# Patient Record
Sex: Male | Born: 1965
Health system: Southern US, Community
[De-identification: ages and names within clinical notes are randomized; demographics above are authoritative.]

## PROBLEM LIST (undated history)

## (undated) DIAGNOSIS — J309 Allergic rhinitis, unspecified: Secondary | ICD-10-CM

## (undated) DIAGNOSIS — T7840XA Allergy, unspecified, initial encounter: Secondary | ICD-10-CM

## (undated) DIAGNOSIS — IMO0002 Reserved for concepts with insufficient information to code with codable children: Secondary | ICD-10-CM

## (undated) DIAGNOSIS — J45909 Unspecified asthma, uncomplicated: Secondary | ICD-10-CM

## (undated) DIAGNOSIS — I10 Essential (primary) hypertension: Secondary | ICD-10-CM

## (undated) DIAGNOSIS — M1990 Unspecified osteoarthritis, unspecified site: Secondary | ICD-10-CM

## (undated) DIAGNOSIS — J449 Chronic obstructive pulmonary disease, unspecified: Secondary | ICD-10-CM

## (undated) HISTORY — DX: Reserved for concepts with insufficient information to code with codable children: IMO0002

## (undated) HISTORY — DX: Unspecified asthma, uncomplicated: J45.909

## (undated) HISTORY — DX: Allergy, unspecified, initial encounter: T78.40XA

## (undated) HISTORY — DX: Chronic obstructive pulmonary disease, unspecified: J44.9

## (undated) HISTORY — DX: Allergic rhinitis, unspecified: J30.9

## (undated) HISTORY — DX: Unspecified osteoarthritis, unspecified site: M19.90

## (undated) HISTORY — DX: Essential (primary) hypertension: I10

---

## 2007-05-13 ENCOUNTER — Emergency Department (HOSPITAL_COMMUNITY): Admission: EM | Admit: 2007-05-13 | Discharge: 2007-05-13 | Payer: Self-pay | Admitting: Emergency Medicine

## 2007-09-27 ENCOUNTER — Encounter: Payer: Self-pay | Admitting: Pulmonary Disease

## 2007-09-27 DIAGNOSIS — J45909 Unspecified asthma, uncomplicated: Secondary | ICD-10-CM | POA: Insufficient documentation

## 2011-06-25 DIAGNOSIS — I10 Essential (primary) hypertension: Secondary | ICD-10-CM | POA: Insufficient documentation

## 2012-03-07 ENCOUNTER — Other Ambulatory Visit: Payer: Self-pay | Admitting: Allergy and Immunology

## 2012-03-07 ENCOUNTER — Ambulatory Visit
Admission: RE | Admit: 2012-03-07 | Discharge: 2012-03-07 | Disposition: A | Discharge status: Home / Self Care | Payer: Self-pay | Source: Ambulatory Visit | Attending: Allergy and Immunology | Admitting: Allergy and Immunology

## 2012-03-07 DIAGNOSIS — R05 Cough: Secondary | ICD-10-CM

## 2012-03-07 DIAGNOSIS — R059 Cough, unspecified: Secondary | ICD-10-CM

## 2013-04-16 IMAGING — CR DG CHEST 2V
2 series · 2 of 2 positions shown · non-contrast
Comparison: No priors.

CLINICAL DATA: Cough and shortness of breath.

CHEST - 2 VIEW

[w chest pa]
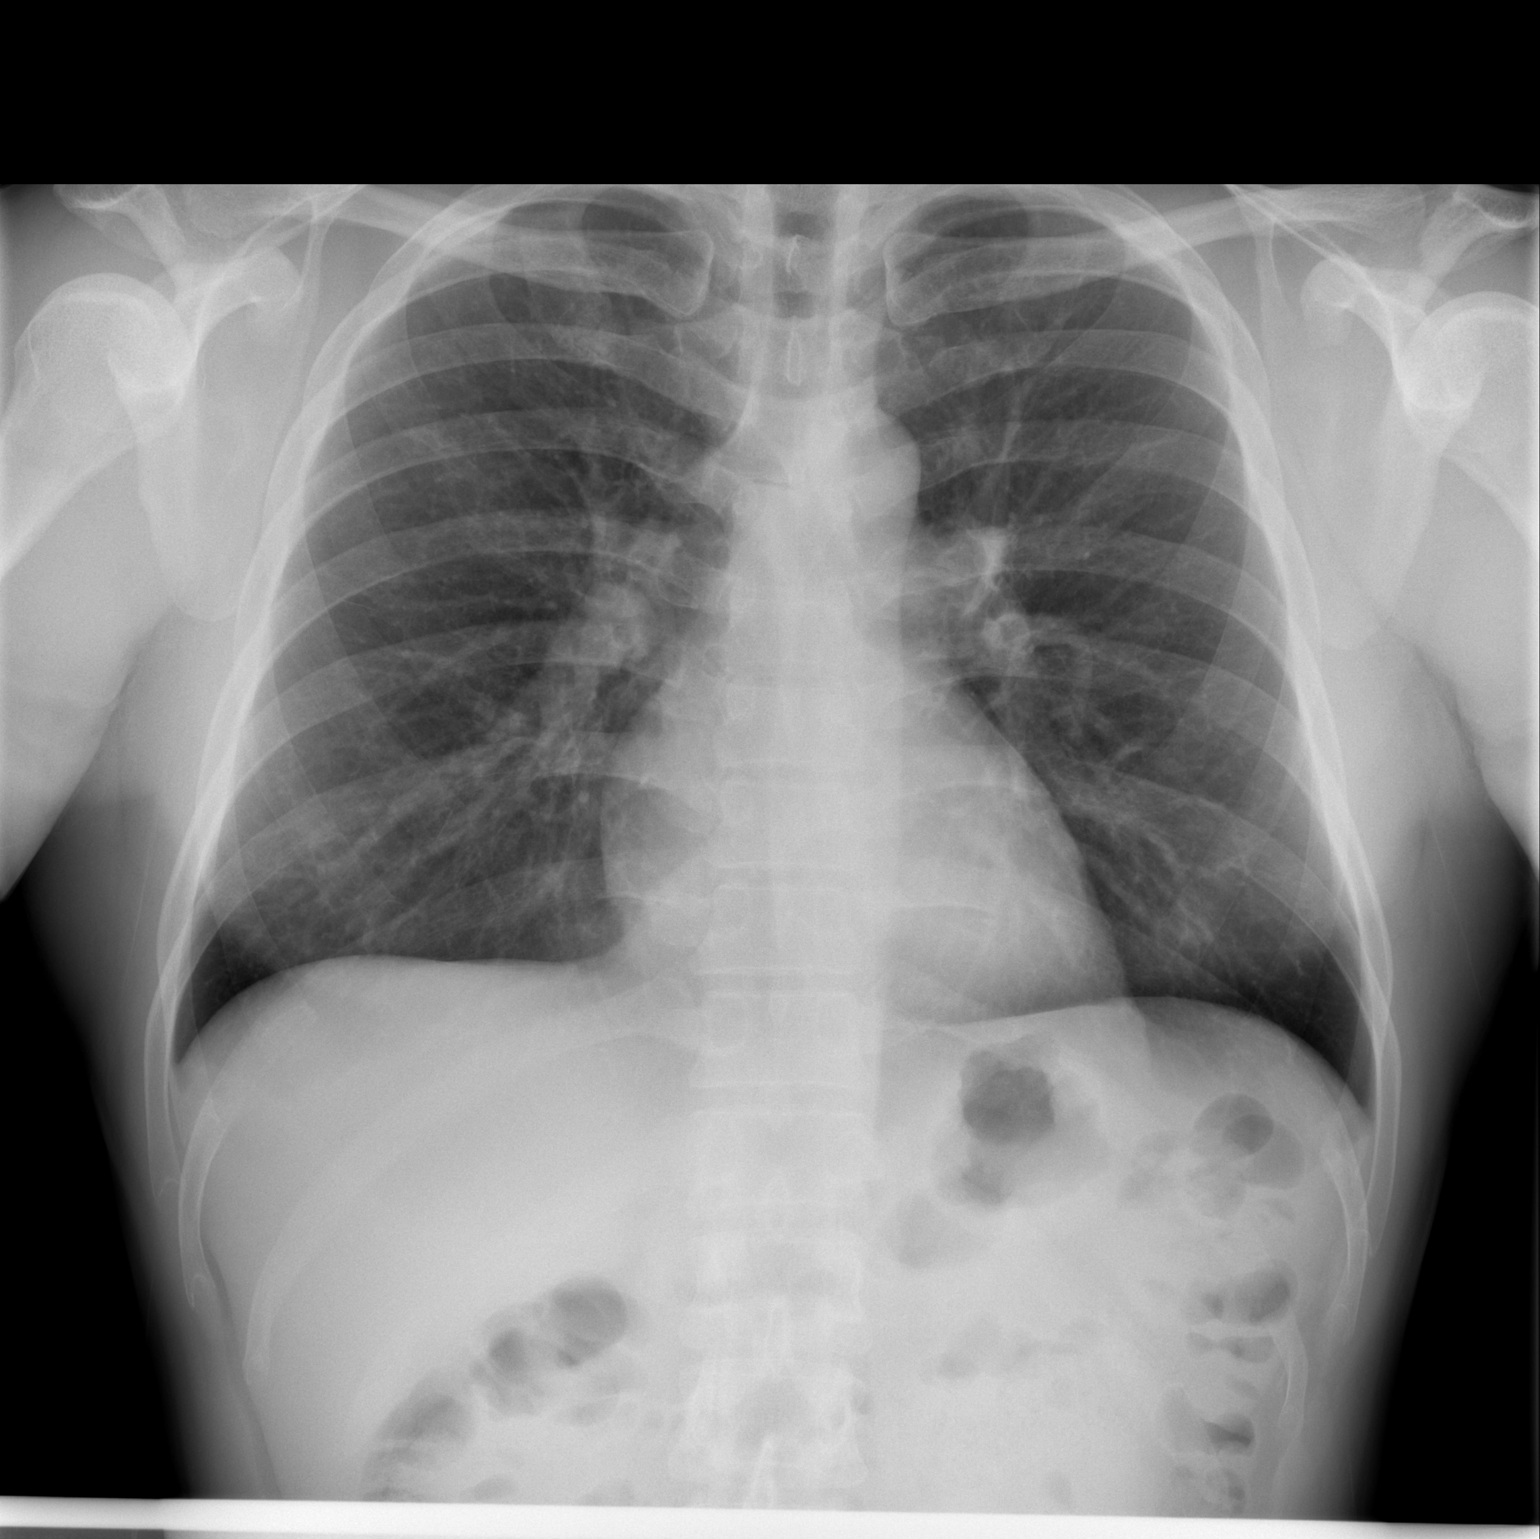

[w chest lat]
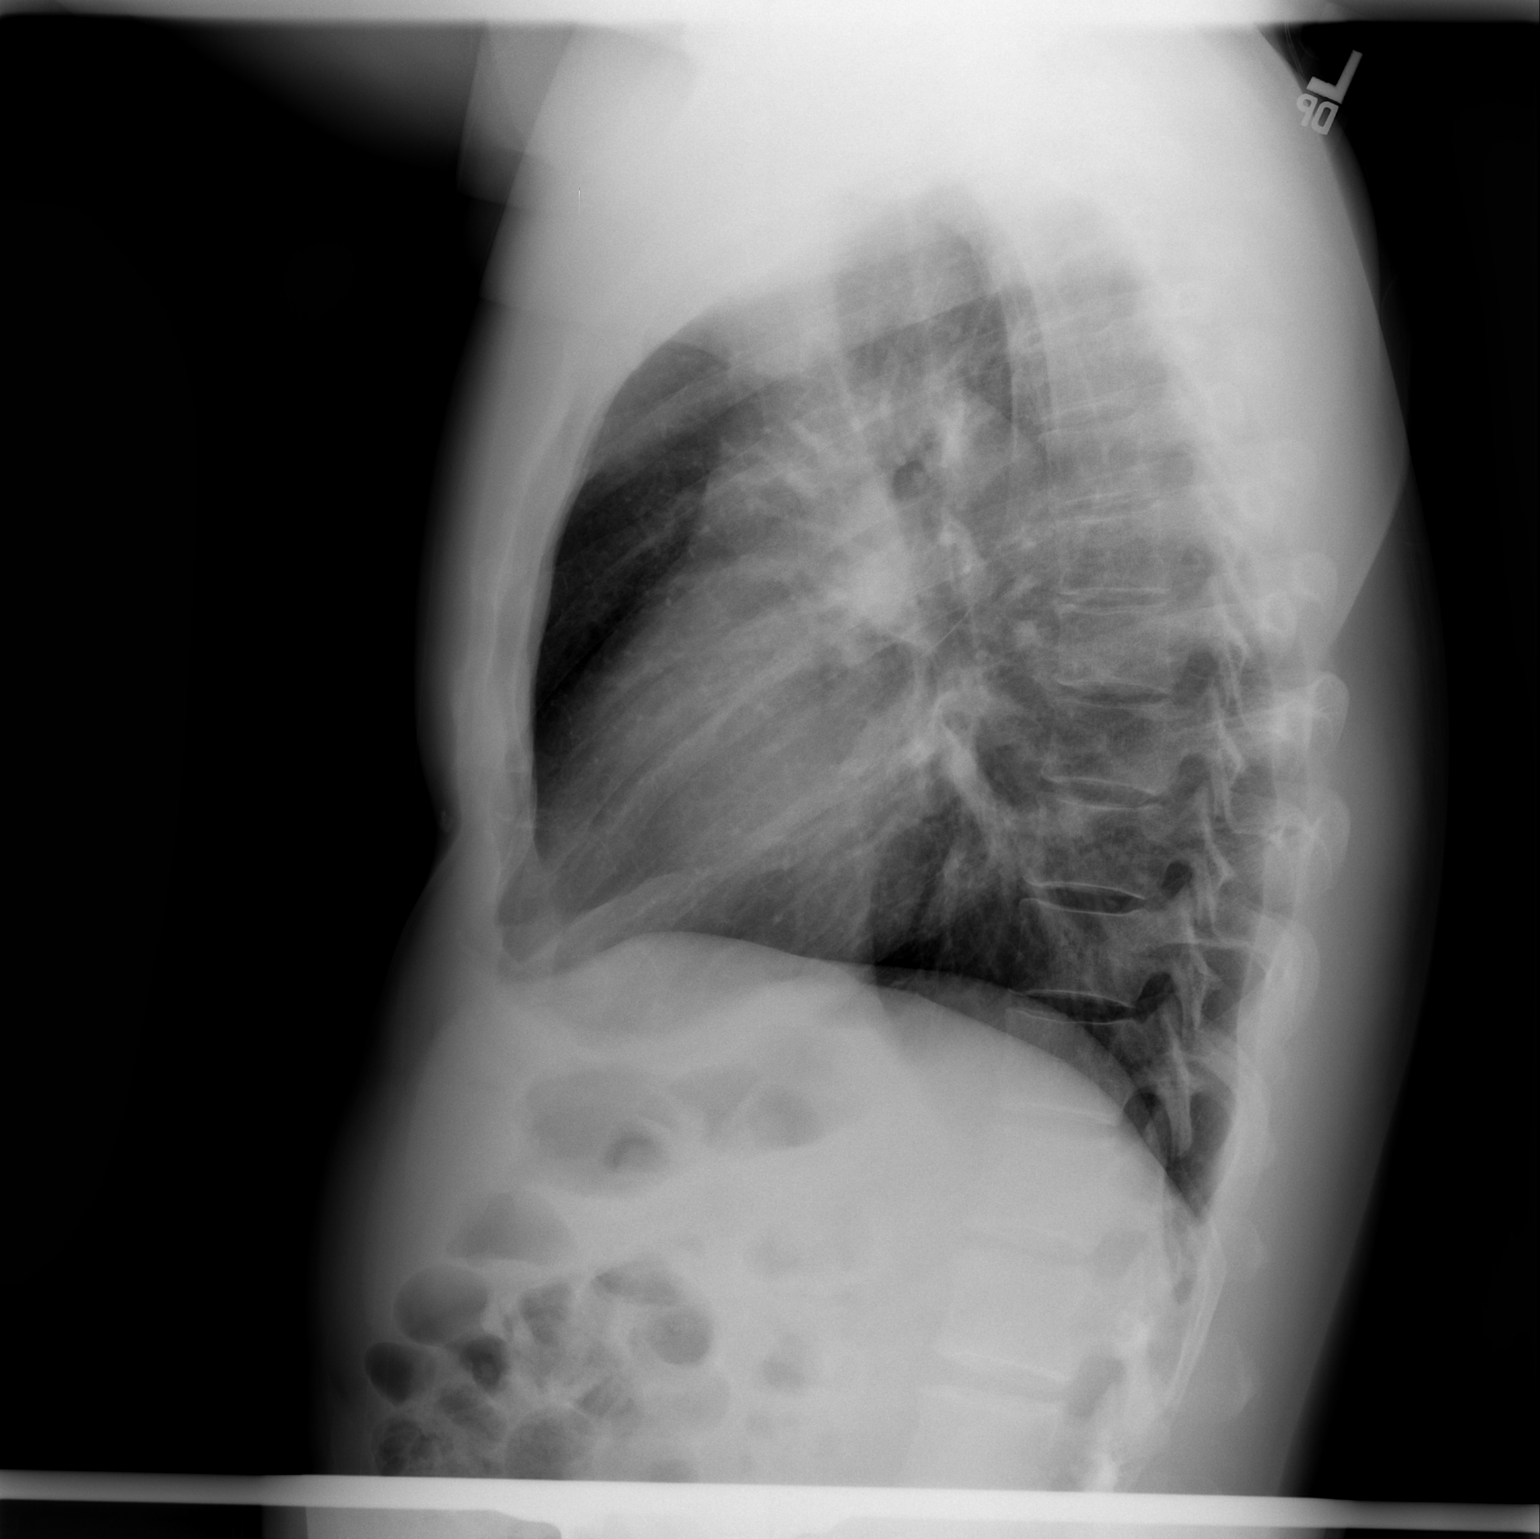

[2 of 2 positions shown; findings below may reference images not displayed]

FINDINGS: Lung volumes are normal.  No consolidative airspace
disease.  No pleural effusions.  No pneumothorax.  No pulmonary
nodule or mass noted.  Pulmonary vasculature and the
cardiomediastinal silhouette are within normal limits.
IMPRESSION: 1. No radiographic evidence of acute cardiopulmonary disease.

## 2015-04-18 DIAGNOSIS — J4550 Severe persistent asthma, uncomplicated: Secondary | ICD-10-CM | POA: Insufficient documentation

## 2015-04-18 DIAGNOSIS — K219 Gastro-esophageal reflux disease without esophagitis: Secondary | ICD-10-CM | POA: Insufficient documentation

## 2015-04-18 DIAGNOSIS — J3089 Other allergic rhinitis: Secondary | ICD-10-CM | POA: Insufficient documentation

## 2015-04-18 DIAGNOSIS — J309 Allergic rhinitis, unspecified: Secondary | ICD-10-CM

## 2015-04-18 DIAGNOSIS — H1010 Acute atopic conjunctivitis, unspecified eye: Secondary | ICD-10-CM

## 2015-04-25 ENCOUNTER — Other Ambulatory Visit: Payer: Self-pay

## 2015-04-25 MED ORDER — OMALIZUMAB 150 MG ~~LOC~~ SOLR
375.0000 mg | SUBCUTANEOUS | Status: DC
  Administered 2015-05-23 – 2016-11-26 (×40): 375 mg via SUBCUTANEOUS

## 2015-05-23 ENCOUNTER — Ambulatory Visit (INDEPENDENT_AMBULATORY_CARE_PROVIDER_SITE_OTHER): Payer: Commercial Managed Care - PPO | Admitting: Neurology

## 2015-05-23 DIAGNOSIS — J4540 Moderate persistent asthma, uncomplicated: Secondary | ICD-10-CM

## 2015-05-23 DIAGNOSIS — J45901 Unspecified asthma with (acute) exacerbation: Secondary | ICD-10-CM

## 2015-06-04 ENCOUNTER — Other Ambulatory Visit: Payer: Self-pay | Admitting: Allergy and Immunology

## 2015-06-06 ENCOUNTER — Ambulatory Visit (INDEPENDENT_AMBULATORY_CARE_PROVIDER_SITE_OTHER): Payer: Commercial Managed Care - PPO

## 2015-06-06 DIAGNOSIS — J4540 Moderate persistent asthma, uncomplicated: Secondary | ICD-10-CM | POA: Diagnosis not present

## 2015-06-06 DIAGNOSIS — J301 Allergic rhinitis due to pollen: Secondary | ICD-10-CM

## 2015-06-13 ENCOUNTER — Other Ambulatory Visit: Payer: Self-pay

## 2015-06-13 MED ORDER — ALBUTEROL SULFATE (2.5 MG/3ML) 0.083% IN NEBU: 2.5000 mg | INHALATION_SOLUTION | RESPIRATORY_TRACT | Status: DC | PRN

## 2015-06-18 ENCOUNTER — Other Ambulatory Visit: Payer: Self-pay

## 2015-06-18 MED ORDER — MONTELUKAST SODIUM 10 MG PO TABS: 10.0000 mg | ORAL_TABLET | Freq: Every day | ORAL | Status: DC

## 2015-06-20 ENCOUNTER — Ambulatory Visit (INDEPENDENT_AMBULATORY_CARE_PROVIDER_SITE_OTHER): Payer: Commercial Managed Care - PPO

## 2015-06-20 DIAGNOSIS — J4540 Moderate persistent asthma, uncomplicated: Secondary | ICD-10-CM

## 2015-07-04 ENCOUNTER — Ambulatory Visit (INDEPENDENT_AMBULATORY_CARE_PROVIDER_SITE_OTHER): Payer: Commercial Managed Care - PPO

## 2015-07-04 DIAGNOSIS — J4550 Severe persistent asthma, uncomplicated: Secondary | ICD-10-CM

## 2015-07-22 ENCOUNTER — Ambulatory Visit (INDEPENDENT_AMBULATORY_CARE_PROVIDER_SITE_OTHER): Payer: Commercial Managed Care - PPO

## 2015-07-22 ENCOUNTER — Other Ambulatory Visit: Payer: Self-pay | Admitting: Allergy and Immunology

## 2015-07-22 DIAGNOSIS — J4540 Moderate persistent asthma, uncomplicated: Secondary | ICD-10-CM

## 2015-07-22 DIAGNOSIS — J309 Allergic rhinitis, unspecified: Secondary | ICD-10-CM

## 2015-08-07 ENCOUNTER — Ambulatory Visit (INDEPENDENT_AMBULATORY_CARE_PROVIDER_SITE_OTHER): Payer: Commercial Managed Care - PPO

## 2015-08-07 DIAGNOSIS — J4540 Moderate persistent asthma, uncomplicated: Secondary | ICD-10-CM | POA: Diagnosis not present

## 2015-08-21 ENCOUNTER — Ambulatory Visit (INDEPENDENT_AMBULATORY_CARE_PROVIDER_SITE_OTHER): Payer: Commercial Managed Care - PPO

## 2015-08-21 DIAGNOSIS — J4540 Moderate persistent asthma, uncomplicated: Secondary | ICD-10-CM | POA: Diagnosis not present

## 2015-09-03 ENCOUNTER — Ambulatory Visit (INDEPENDENT_AMBULATORY_CARE_PROVIDER_SITE_OTHER): Payer: Commercial Managed Care - PPO | Admitting: Neurology

## 2015-09-03 DIAGNOSIS — J4550 Severe persistent asthma, uncomplicated: Secondary | ICD-10-CM

## 2015-09-17 ENCOUNTER — Ambulatory Visit (INDEPENDENT_AMBULATORY_CARE_PROVIDER_SITE_OTHER): Payer: Commercial Managed Care - PPO

## 2015-09-17 DIAGNOSIS — J4540 Moderate persistent asthma, uncomplicated: Secondary | ICD-10-CM | POA: Diagnosis not present

## 2015-09-21 ENCOUNTER — Other Ambulatory Visit: Payer: Self-pay | Admitting: Allergy and Immunology

## 2015-10-01 ENCOUNTER — Ambulatory Visit (INDEPENDENT_AMBULATORY_CARE_PROVIDER_SITE_OTHER): Payer: Commercial Managed Care - PPO

## 2015-10-01 DIAGNOSIS — J4540 Moderate persistent asthma, uncomplicated: Secondary | ICD-10-CM

## 2015-10-12 ENCOUNTER — Other Ambulatory Visit: Payer: Self-pay | Admitting: Allergy and Immunology

## 2015-10-15 ENCOUNTER — Ambulatory Visit (INDEPENDENT_AMBULATORY_CARE_PROVIDER_SITE_OTHER): Payer: Commercial Managed Care - PPO

## 2015-10-15 DIAGNOSIS — J309 Allergic rhinitis, unspecified: Secondary | ICD-10-CM

## 2015-10-15 DIAGNOSIS — J4540 Moderate persistent asthma, uncomplicated: Secondary | ICD-10-CM | POA: Diagnosis not present

## 2015-10-29 ENCOUNTER — Ambulatory Visit (INDEPENDENT_AMBULATORY_CARE_PROVIDER_SITE_OTHER): Payer: Commercial Managed Care - PPO

## 2015-10-29 DIAGNOSIS — J4540 Moderate persistent asthma, uncomplicated: Secondary | ICD-10-CM

## 2015-11-12 ENCOUNTER — Ambulatory Visit (INDEPENDENT_AMBULATORY_CARE_PROVIDER_SITE_OTHER): Payer: Commercial Managed Care - PPO

## 2015-11-12 DIAGNOSIS — J4540 Moderate persistent asthma, uncomplicated: Secondary | ICD-10-CM | POA: Diagnosis not present

## 2015-11-25 ENCOUNTER — Other Ambulatory Visit: Payer: Self-pay | Admitting: Allergy and Immunology

## 2015-11-26 ENCOUNTER — Ambulatory Visit (INDEPENDENT_AMBULATORY_CARE_PROVIDER_SITE_OTHER): Payer: Commercial Managed Care - PPO

## 2015-11-26 DIAGNOSIS — J4540 Moderate persistent asthma, uncomplicated: Secondary | ICD-10-CM

## 2015-12-12 ENCOUNTER — Ambulatory Visit (INDEPENDENT_AMBULATORY_CARE_PROVIDER_SITE_OTHER): Payer: Commercial Managed Care - PPO | Admitting: *Deleted

## 2015-12-12 DIAGNOSIS — J4540 Moderate persistent asthma, uncomplicated: Secondary | ICD-10-CM | POA: Diagnosis not present

## 2015-12-26 ENCOUNTER — Ambulatory Visit (INDEPENDENT_AMBULATORY_CARE_PROVIDER_SITE_OTHER): Payer: Commercial Managed Care - PPO | Admitting: *Deleted

## 2015-12-26 DIAGNOSIS — J4540 Moderate persistent asthma, uncomplicated: Secondary | ICD-10-CM

## 2016-01-09 ENCOUNTER — Ambulatory Visit (INDEPENDENT_AMBULATORY_CARE_PROVIDER_SITE_OTHER): Payer: Commercial Managed Care - PPO | Admitting: *Deleted

## 2016-01-09 DIAGNOSIS — J4540 Moderate persistent asthma, uncomplicated: Secondary | ICD-10-CM | POA: Diagnosis not present

## 2016-01-11 ENCOUNTER — Other Ambulatory Visit: Payer: Self-pay | Admitting: Allergy and Immunology

## 2016-01-13 ENCOUNTER — Other Ambulatory Visit: Payer: Self-pay | Admitting: Allergy and Immunology

## 2016-01-13 ENCOUNTER — Other Ambulatory Visit: Payer: Self-pay | Admitting: *Deleted

## 2016-01-13 MED ORDER — MOMETASONE FURO-FORMOTEROL FUM 200-5 MCG/ACT IN AERO: INHALATION_SPRAY | RESPIRATORY_TRACT | Status: DC

## 2016-01-15 ENCOUNTER — Other Ambulatory Visit: Payer: Self-pay | Admitting: Allergy and Immunology

## 2016-01-20 ENCOUNTER — Ambulatory Visit: Payer: Commercial Managed Care - PPO | Admitting: Allergy and Immunology

## 2016-01-23 ENCOUNTER — Ambulatory Visit (INDEPENDENT_AMBULATORY_CARE_PROVIDER_SITE_OTHER): Payer: Commercial Managed Care - PPO | Admitting: *Deleted

## 2016-01-23 DIAGNOSIS — J4540 Moderate persistent asthma, uncomplicated: Secondary | ICD-10-CM | POA: Diagnosis not present

## 2016-02-01 ENCOUNTER — Other Ambulatory Visit: Payer: Self-pay | Admitting: Allergy and Immunology

## 2016-02-03 ENCOUNTER — Ambulatory Visit (INDEPENDENT_AMBULATORY_CARE_PROVIDER_SITE_OTHER): Payer: Commercial Managed Care - PPO | Admitting: Allergy and Immunology

## 2016-02-03 ENCOUNTER — Encounter: Payer: Self-pay | Admitting: Allergy and Immunology

## 2016-02-03 VITALS — BP 120/78 | HR 68 | Resp 16 | Ht 65.3 in | Wt 158.6 lb

## 2016-02-03 DIAGNOSIS — J309 Allergic rhinitis, unspecified: Secondary | ICD-10-CM

## 2016-02-03 DIAGNOSIS — J4541 Moderate persistent asthma with (acute) exacerbation: Secondary | ICD-10-CM | POA: Diagnosis not present

## 2016-02-03 DIAGNOSIS — H1010 Acute atopic conjunctivitis, unspecified eye: Secondary | ICD-10-CM

## 2016-02-03 MED ORDER — ALBUTEROL SULFATE HFA 108 (90 BASE) MCG/ACT IN AERS: INHALATION_SPRAY | RESPIRATORY_TRACT | Status: DC

## 2016-02-03 MED ORDER — AZELASTINE-FLUTICASONE 137-50 MCG/ACT NA SUSP: NASAL | Status: DC

## 2016-02-03 MED ORDER — IPRATROPIUM-ALBUTEROL 0.5-2.5 (3) MG/3ML IN SOLN
3.0000 mL | Freq: Once | RESPIRATORY_TRACT | Status: AC
  Administered 2016-02-03: 3 mL via RESPIRATORY_TRACT

## 2016-02-03 MED ORDER — MONTELUKAST SODIUM 10 MG PO TABS: 10.0000 mg | ORAL_TABLET | Freq: Every day | ORAL | Status: DC

## 2016-02-03 MED ORDER — METHYLPREDNISOLONE ACETATE 80 MG/ML IJ SUSP
80.0000 mg | Freq: Once | INTRAMUSCULAR | Status: AC
  Administered 2016-02-03: 80 mg via INTRAMUSCULAR

## 2016-02-03 MED ORDER — MOMETASONE FURO-FORMOTEROL FUM 200-5 MCG/ACT IN AERO: INHALATION_SPRAY | RESPIRATORY_TRACT | Status: DC

## 2016-02-03 MED ORDER — ALBUTEROL SULFATE (2.5 MG/3ML) 0.083% IN NEBU: INHALATION_SOLUTION | RESPIRATORY_TRACT | Status: DC

## 2016-02-03 NOTE — Progress Notes (Signed)
Follow-up Note  Referring Provider: No ref. provider found Primary Provider: No PCP Per Patient Date of Office Visit: 02/03/2016  Subjective:   Preston Taylor (DOB: 04/30/1966) is a 50 y.o. male who returns to the [facility] on 02/03/2016 in re-evaluation of the following:  HPI: Preston Taylor to this clinic in evaluation of a asthma exacerbation that started approximately 10-14 days ago preceded by rhinitis. Even though his nasal congestion and rhinorrhea and drainage has improved he still continues to have issues with coughing and wheezing and using a bronchodilator multiple times a day. His chest may be a little bit better over the course of the past 4-5 days. He never developed a high fever or ugly nasal discharge or ugly sputum production.  Prior to this flareup he did quite well for the past 6 months without an exacerbation of his asthma requiring a systemic steroid and rarely A requirement for short acting bronchodilator as long as he continued to use his Dulera and Xolair and montelukast in combination. Likewise, his nose was doing quite well while using a combination nasal steroid and antihistamine spray. His reflux has completely abated and he no longer needs to use any therapy for this condition.    Medication List           albuterol (2.5 MG/3ML) 0.083% nebulizer solution  Commonly known as:  PROVENTIL  Take 3 mLs (2.5 mg total) by nebulization every 4 (four) hours as needed for wheezing or shortness of breath.     albuterol 108 (90 Base) MCG/ACT inhaler  Commonly known as:  PROAIR HFA  Inhale two puffs every four to six hours as needed for cough or wheeze.     beclomethasone 80 MCG/ACT inhaler  Commonly known as:  QVAR  Inhale 2 puffs into the lungs 2 (two) times daily. Reported on 02/03/2016     EPIPEN 2-PAK 0.3 mg/0.3 mL Soaj injection  Generic drug:  EPINEPHrine  Inject 0.3 mg into the muscle once.     mometasone-formoterol 200-5 MCG/ACT Aero  Commonly  known as:  DULERA  INHALE 2 PUFFS BY MOUTH EVERY 12 HOURS TO PREVENT COUGH OR WHEEZE     montelukast 10 MG tablet  Commonly known as:  SINGULAIR  TAKE 1 TABLET BY MOUTH AT BEDTIME     omeprazole 20 MG capsule  Commonly known as:  PRILOSEC  Take 20 mg by mouth 2 (two) times daily.     QNASL 80 MCG/ACT Aers  Generic drug:  Beclomethasone Dipropionate  INSTILL ONE SPRAY INTO EACH NOSTRIL TWICE DAILY FOR STUFFY NOSE OR DRAINAGE     XOLAIR 150 MG injection  Generic drug:  omalizumab  MIX AND INJECT 375 MG UNDER THE SKIN EVERY TWO WEEKS        Past Medical History  Diagnosis Date  . Asthma     History reviewed. No pertinent past surgical history.  No Known Allergies  Review of systems negative except as noted in HPI / PMHx or noted below:  Review of Systems  Constitutional: Negative.   HENT: Negative.   Eyes: Negative.   Respiratory: Negative.   Cardiovascular: Negative.   Gastrointestinal: Negative.   Genitourinary: Negative.   Musculoskeletal: Negative.   Skin: Negative.   Neurological: Negative.   Endo/Heme/Allergies: Negative.   Psychiatric/Behavioral: Negative.      Objective:   Filed Vitals:   02/03/16 1811  BP: 120/78  Pulse: 68  Resp: 16   Height: 5' 5.3" (165.9 cm)  Weight: 158 lb 9.6 oz (71.94 kg)   Physical Exam  Constitutional: He is well-developed, well-nourished, and in no distress.  HENT:  Head: Normocephalic.  Right Ear: Tympanic membrane, external ear and ear canal normal.  Left Ear: Tympanic membrane, external ear and ear canal normal.  Nose: Nose normal. No mucosal edema or rhinorrhea.  Mouth/Throat: Uvula is midline, oropharynx is clear and moist and mucous membranes are normal. No oropharyngeal exudate.  Eyes: Conjunctivae are normal.  Neck: Trachea normal. No tracheal tenderness present. No tracheal deviation present. No thyromegaly present.  Cardiovascular: Normal rate, regular rhythm, S1 normal, S2 normal and normal heart sounds.    No murmur heard. Pulmonary/Chest: No stridor. No respiratory distress. He has wheezes (Bilateral expiratory wheezing heard on forced expiration). He has no rales.  Musculoskeletal: He exhibits no edema.  Lymphadenopathy:       Head (right side): No tonsillar adenopathy present.       Head (left side): No tonsillar adenopathy present.    He has no cervical adenopathy.  Neurological: He is alert. Gait normal.  Skin: No rash noted. He is not diaphoretic. No erythema. Nails show no clubbing.  Psychiatric: Mood and affect normal.    Diagnostics:    Spirometry was performed and demonstrated an FEV1 of 2.77 at 92 % of predicted.  The patient had an Asthma Control Test with the following results: ACT Total Score: 10.    Assessment and Plan:   1. Asthma, not well controlled, moderate persistent, with acute exacerbation   2. Allergic rhinoconjunctivitis     1. Depo-Medrol 80 IM and DuoNeb nebulization delivered in clinic today  2. Add sample Qvar 80 - 2 inhalations twice a day to Preston Taylor while sick  3. Continue Dulera 200 - 2 inhalations twice a day  4. Continue montelukast 10 mg daily  5. Continue Dymista one spray each nostril twice a day  6. Continue Xolair and EpiPen  7. Continue ProAir HFA or albuterol nebulization if needed  8. Obtain fall flu vaccine  9. Return to clinic in 6 months or earlier if problem  Preston Taylor appears to have a viral-induced respiratory tract flare involving both his upper and lower airways for which we'll have him utilize the therapy mentioned above. He'll keep in contact with [place] noting his response as he moves forward. If he does not respond adequately to this therapy he'll contact this clinic and we'll give him a different kind of plan. Otherwise, if he does well I will see him back in this clinic in 6 months while he continues to use anti-inflammatory medications for his respiratory tract and omalizumab consistently.  Allena Katz, MD [place]

## 2016-02-03 NOTE — Patient Instructions (Addendum)
  1. Depo-Medrol 80 IM and DuoNeb nebulization delivered in clinic today  2. Add sample Qvar 80 - 2 inhalations twice a day to [facility] while sick  3. Continue Dulera 200 - 2 inhalations twice a day  4. Continue montelukast 10 mg daily  5. Continue Dymista one spray each nostril twice a day  6. Continue Xolair and EpiPen  7. Continue ProAir HFA or albuterol nebulization if needed  8. Obtain fall flu vaccine  9. Return to clinic in 6 months or earlier if problem

## 2016-02-06 ENCOUNTER — Ambulatory Visit (INDEPENDENT_AMBULATORY_CARE_PROVIDER_SITE_OTHER): Payer: Commercial Managed Care - PPO

## 2016-02-06 DIAGNOSIS — J4540 Moderate persistent asthma, uncomplicated: Secondary | ICD-10-CM

## 2016-02-20 ENCOUNTER — Ambulatory Visit (INDEPENDENT_AMBULATORY_CARE_PROVIDER_SITE_OTHER): Payer: Commercial Managed Care - PPO | Admitting: *Deleted

## 2016-02-20 DIAGNOSIS — J4540 Moderate persistent asthma, uncomplicated: Secondary | ICD-10-CM

## 2016-02-29 ENCOUNTER — Other Ambulatory Visit: Payer: Self-pay | Admitting: Allergy and Immunology

## 2016-03-05 ENCOUNTER — Ambulatory Visit (INDEPENDENT_AMBULATORY_CARE_PROVIDER_SITE_OTHER): Payer: Commercial Managed Care - PPO | Admitting: *Deleted

## 2016-03-05 DIAGNOSIS — J4540 Moderate persistent asthma, uncomplicated: Secondary | ICD-10-CM | POA: Diagnosis not present

## 2016-03-07 ENCOUNTER — Other Ambulatory Visit: Payer: Self-pay | Admitting: Allergy and Immunology

## 2016-03-19 ENCOUNTER — Ambulatory Visit (INDEPENDENT_AMBULATORY_CARE_PROVIDER_SITE_OTHER): Payer: Commercial Managed Care - PPO

## 2016-03-19 DIAGNOSIS — J4540 Moderate persistent asthma, uncomplicated: Secondary | ICD-10-CM | POA: Diagnosis not present

## 2016-04-02 ENCOUNTER — Ambulatory Visit (INDEPENDENT_AMBULATORY_CARE_PROVIDER_SITE_OTHER): Payer: Commercial Managed Care - PPO | Admitting: *Deleted

## 2016-04-02 ENCOUNTER — Ambulatory Visit: Payer: Self-pay

## 2016-04-02 DIAGNOSIS — J4540 Moderate persistent asthma, uncomplicated: Secondary | ICD-10-CM

## 2016-04-09 ENCOUNTER — Other Ambulatory Visit: Payer: Self-pay | Admitting: Allergy and Immunology

## 2016-04-16 ENCOUNTER — Ambulatory Visit (INDEPENDENT_AMBULATORY_CARE_PROVIDER_SITE_OTHER): Payer: Commercial Managed Care - PPO

## 2016-04-16 ENCOUNTER — Ambulatory Visit: Payer: Self-pay

## 2016-04-16 DIAGNOSIS — J4540 Moderate persistent asthma, uncomplicated: Secondary | ICD-10-CM | POA: Diagnosis not present

## 2016-04-20 ENCOUNTER — Telehealth: Payer: Self-pay | Admitting: Allergy and Immunology

## 2016-04-20 NOTE — Telephone Encounter (Signed)
Pt is requesting a call back regarding a bill that he received for a payment that he already made and stated that he has the receipt. He asked to please call back after 3pm when he gets off of work and can answer his phone. Thanks

## 2016-04-21 NOTE — Telephone Encounter (Signed)
Lm that I am sending him printouts explaining his bill

## 2016-04-30 ENCOUNTER — Ambulatory Visit (INDEPENDENT_AMBULATORY_CARE_PROVIDER_SITE_OTHER): Payer: Commercial Managed Care - PPO

## 2016-04-30 ENCOUNTER — Ambulatory Visit: Payer: Commercial Managed Care - PPO

## 2016-04-30 DIAGNOSIS — J4540 Moderate persistent asthma, uncomplicated: Secondary | ICD-10-CM | POA: Diagnosis not present

## 2016-05-14 ENCOUNTER — Ambulatory Visit (INDEPENDENT_AMBULATORY_CARE_PROVIDER_SITE_OTHER): Payer: Commercial Managed Care - PPO

## 2016-05-14 DIAGNOSIS — J4540 Moderate persistent asthma, uncomplicated: Secondary | ICD-10-CM | POA: Diagnosis not present

## 2016-05-28 ENCOUNTER — Ambulatory Visit (INDEPENDENT_AMBULATORY_CARE_PROVIDER_SITE_OTHER): Payer: Commercial Managed Care - PPO

## 2016-05-28 DIAGNOSIS — J4540 Moderate persistent asthma, uncomplicated: Secondary | ICD-10-CM | POA: Diagnosis not present

## 2016-05-31 ENCOUNTER — Ambulatory Visit: Payer: Commercial Managed Care - PPO | Admitting: Allergy and Immunology

## 2016-06-02 ENCOUNTER — Other Ambulatory Visit: Payer: Self-pay | Admitting: Allergy and Immunology

## 2016-06-11 ENCOUNTER — Ambulatory Visit (INDEPENDENT_AMBULATORY_CARE_PROVIDER_SITE_OTHER): Payer: Commercial Managed Care - PPO

## 2016-06-11 DIAGNOSIS — J4540 Moderate persistent asthma, uncomplicated: Secondary | ICD-10-CM

## 2016-06-22 ENCOUNTER — Ambulatory Visit: Payer: Commercial Managed Care - PPO | Admitting: Allergy and Immunology

## 2016-06-25 ENCOUNTER — Other Ambulatory Visit: Payer: Self-pay | Admitting: Allergy and Immunology

## 2016-06-25 ENCOUNTER — Ambulatory Visit (INDEPENDENT_AMBULATORY_CARE_PROVIDER_SITE_OTHER): Payer: Commercial Managed Care - PPO

## 2016-06-25 DIAGNOSIS — J4540 Moderate persistent asthma, uncomplicated: Secondary | ICD-10-CM | POA: Diagnosis not present

## 2016-07-12 ENCOUNTER — Ambulatory Visit (INDEPENDENT_AMBULATORY_CARE_PROVIDER_SITE_OTHER): Payer: Commercial Managed Care - PPO | Admitting: *Deleted

## 2016-07-12 DIAGNOSIS — J4540 Moderate persistent asthma, uncomplicated: Secondary | ICD-10-CM | POA: Diagnosis not present

## 2016-07-23 ENCOUNTER — Ambulatory Visit: Payer: Commercial Managed Care - PPO

## 2016-07-23 ENCOUNTER — Ambulatory Visit (INDEPENDENT_AMBULATORY_CARE_PROVIDER_SITE_OTHER): Payer: Commercial Managed Care - PPO | Admitting: *Deleted

## 2016-07-23 DIAGNOSIS — J4540 Moderate persistent asthma, uncomplicated: Secondary | ICD-10-CM

## 2016-07-30 ENCOUNTER — Other Ambulatory Visit: Payer: Self-pay | Admitting: Allergy and Immunology

## 2016-08-06 ENCOUNTER — Ambulatory Visit (INDEPENDENT_AMBULATORY_CARE_PROVIDER_SITE_OTHER): Payer: Commercial Managed Care - PPO

## 2016-08-06 ENCOUNTER — Ambulatory Visit: Payer: Commercial Managed Care - PPO

## 2016-08-06 DIAGNOSIS — J4540 Moderate persistent asthma, uncomplicated: Secondary | ICD-10-CM | POA: Diagnosis not present

## 2016-08-10 ENCOUNTER — Other Ambulatory Visit: Payer: Self-pay | Admitting: Allergy and Immunology

## 2016-08-20 ENCOUNTER — Ambulatory Visit: Payer: Commercial Managed Care - PPO

## 2016-08-20 ENCOUNTER — Ambulatory Visit (INDEPENDENT_AMBULATORY_CARE_PROVIDER_SITE_OTHER): Payer: Commercial Managed Care - PPO

## 2016-08-20 DIAGNOSIS — J4540 Moderate persistent asthma, uncomplicated: Secondary | ICD-10-CM

## 2016-08-20 DIAGNOSIS — J4550 Severe persistent asthma, uncomplicated: Secondary | ICD-10-CM

## 2016-08-22 ENCOUNTER — Other Ambulatory Visit: Payer: Self-pay | Admitting: Allergy and Immunology

## 2016-08-26 DIAGNOSIS — R509 Fever, unspecified: Secondary | ICD-10-CM | POA: Diagnosis not present

## 2016-08-26 DIAGNOSIS — R918 Other nonspecific abnormal finding of lung field: Secondary | ICD-10-CM | POA: Diagnosis not present

## 2016-08-26 DIAGNOSIS — J181 Lobar pneumonia, unspecified organism: Secondary | ICD-10-CM | POA: Diagnosis not present

## 2016-08-26 DIAGNOSIS — R05 Cough: Secondary | ICD-10-CM | POA: Diagnosis not present

## 2016-09-03 ENCOUNTER — Ambulatory Visit (INDEPENDENT_AMBULATORY_CARE_PROVIDER_SITE_OTHER): Payer: Commercial Managed Care - PPO

## 2016-09-03 DIAGNOSIS — J4540 Moderate persistent asthma, uncomplicated: Secondary | ICD-10-CM

## 2016-09-07 ENCOUNTER — Other Ambulatory Visit: Payer: Self-pay | Admitting: Allergy and Immunology

## 2016-09-11 DIAGNOSIS — Z9109 Other allergy status, other than to drugs and biological substances: Secondary | ICD-10-CM | POA: Diagnosis not present

## 2016-09-11 DIAGNOSIS — I10 Essential (primary) hypertension: Secondary | ICD-10-CM | POA: Diagnosis not present

## 2016-09-13 ENCOUNTER — Other Ambulatory Visit: Payer: Self-pay | Admitting: Allergy and Immunology

## 2016-09-13 DIAGNOSIS — I10 Essential (primary) hypertension: Secondary | ICD-10-CM | POA: Diagnosis not present

## 2016-09-14 ENCOUNTER — Encounter: Payer: Self-pay | Admitting: Allergy and Immunology

## 2016-09-14 ENCOUNTER — Ambulatory Visit (INDEPENDENT_AMBULATORY_CARE_PROVIDER_SITE_OTHER): Payer: Commercial Managed Care - PPO | Admitting: Allergy and Immunology

## 2016-09-14 ENCOUNTER — Encounter (INDEPENDENT_AMBULATORY_CARE_PROVIDER_SITE_OTHER): Payer: Self-pay

## 2016-09-14 VITALS — BP 122/68 | HR 100 | Resp 26

## 2016-09-14 DIAGNOSIS — H1010 Acute atopic conjunctivitis, unspecified eye: Secondary | ICD-10-CM | POA: Diagnosis not present

## 2016-09-14 DIAGNOSIS — K219 Gastro-esophageal reflux disease without esophagitis: Secondary | ICD-10-CM | POA: Diagnosis not present

## 2016-09-14 DIAGNOSIS — J4540 Moderate persistent asthma, uncomplicated: Secondary | ICD-10-CM

## 2016-09-14 DIAGNOSIS — J309 Allergic rhinitis, unspecified: Secondary | ICD-10-CM

## 2016-09-14 MED ORDER — MONTELUKAST SODIUM 10 MG PO TABS: ORAL_TABLET | ORAL | 5 refills | Status: DC

## 2016-09-14 MED ORDER — ALBUTEROL SULFATE (2.5 MG/3ML) 0.083% IN NEBU: INHALATION_SOLUTION | RESPIRATORY_TRACT | 1 refills | Status: DC

## 2016-09-14 MED ORDER — MOMETASONE FURO-FORMOTEROL FUM 200-5 MCG/ACT IN AERO: INHALATION_SPRAY | RESPIRATORY_TRACT | 5 refills | Status: DC

## 2016-09-14 MED ORDER — OMEPRAZOLE 20 MG PO CPDR: 20.0000 mg | DELAYED_RELEASE_CAPSULE | Freq: Two times a day (BID) | ORAL | 5 refills | Status: DC

## 2016-09-14 MED ORDER — ALBUTEROL SULFATE HFA 108 (90 BASE) MCG/ACT IN AERS: INHALATION_SPRAY | RESPIRATORY_TRACT | 1 refills | Status: DC

## 2016-09-14 MED ORDER — AZELASTINE-FLUTICASONE 137-50 MCG/ACT NA SUSP: NASAL | 5 refills | Status: DC

## 2016-09-14 MED ORDER — BECLOMETHASONE DIPROPIONATE 80 MCG/ACT IN AERS: INHALATION_SPRAY | RESPIRATORY_TRACT | 5 refills | Status: DC

## 2016-09-14 NOTE — Patient Instructions (Addendum)
  1. Continue Dulera 200 - 2 inhalations twice a day (samples)  2. Add Qvar 80 - 2 inhalations twice a day to [facility] while "sick" (samples)  3. Continue montelukast 10 mg daily  4. Continue Qnasl or Dymista one spray each nostril 1-2 time per day  5. Continue Xolair and EpiPen  6. Continue ProAir HFA or albuterol nebulization if needed  7. Return to clinic in 6 months or earlier if problem

## 2016-09-14 NOTE — Progress Notes (Signed)
Follow-up Note  Referring Provider: No ref. provider found Primary Provider: No PCP Per Patient Date of Office Visit: 09/14/2016  Subjective:   Preston Taylor (DOB: [birth year]) is a 51 y.o. male who returns to the [facility] on 09/14/2016 in re-evaluation of the following:  HPI: Preston Taylor returns to this clinic in reevaluation of his severe asthma treated with omalizumab and allergic rhinitis and reflux. I have not seen him in this clinic since June 2017.  He was doing very well with his asthma without the need for systemic steroid up until somewhere past Christmas. At that point time he developed a head cold and then this transitioned into a "pneumonia" with a chest x-ray which identified an abnormality. He was given systemic steroids and antibiotics. He is better regarding all his respiratory tract symptoms but still continues to have some occasional chest tightness and still has a requirement for bronchodilator prior to bedtime.  He was using his Dulera consistently in addition to Xolair but unfortunately over the course of the past month he's had to discontinue his Ruthe Mannan because of expense issue. He's now used some samples of Qvar that he had around the house.  He's had very little issues with his nose other than described above.  He is treating his reflux with omeprazole twice a day which is working quite well. If he misses omeprazole he does get stomach upset and pain.  He did receive the flu vaccine this year.  Allergies as of 09/14/2016      Reactions   Lisinopril Nausea And Vomiting      Medication List      amLODipine 10 MG tablet Commonly known as:  NORVASC   Azelastine-Fluticasone 137-50 MCG/ACT Susp Commonly known as:  DYMISTA USE ONE SPRAY IN EACH NOSTRIL TWICE DAILY   EPIPEN 2-PAK 0.3 mg/0.3 mL Soaj injection Generic drug:  EPINEPHrine Inject 0.3 mg into the muscle once.   mometasone-formoterol 200-5 MCG/ACT Aero Commonly known as:  DULERA INHALE 2  PUFFS BY MOUTH EVERY 12 HOURS TO PREVENT COUGH OR WHEEZE   montelukast 10 MG tablet Commonly known as:  SINGULAIR Take one tablet once daily as directed   omeprazole 20 MG capsule Commonly known as:  PRILOSEC Take 20 mg by mouth 2 (two) times daily.   PROAIR HFA 108 (90 Base) MCG/ACT inhaler Generic drug:  albuterol INHALE 2 PUFFS BY MOUTH EVERY 4 TO 6 HOURS AS NEEDED FOR COUGH OR WHEEZE   albuterol (2.5 MG/3ML) 0.083% nebulizer solution Commonly known as:  PROVENTIL USE 1 VIAL VIA NEBULIZER EVERY 4 TO 6 HOURS AS NEEDED FOR COUGH OR WHEEZING   QNASL 80 MCG/ACT Aers Generic drug:  Beclomethasone Dipropionate INSTILL 1 SPRAY IN EACH NOSTRIL TWICE DAILY FOR STUFFY NOSE OR DRAINAGE.   QVAR 80 MCG/ACT inhaler Generic drug:  beclomethasone INHALE 2 PUFFS INTO THE LUNGS TWICE DAILY   simvastatin 10 MG tablet Commonly known as:  ZOCOR   XOLAIR 150 MG injection Generic drug:  omalizumab MIX AND INJECT 375 MG UNDER THE SKIN EVERY TWO WEEKS       Past Medical History:  Diagnosis Date  . Asthma     No past surgical history on file.  Review of systems negative except as noted in HPI / PMHx or noted below:  Review of Systems  Constitutional: Negative.   HENT: Negative.   Eyes: Negative.   Respiratory: Negative.   Cardiovascular: Negative.   Gastrointestinal: Negative.   Genitourinary: Negative.   Musculoskeletal:  Negative.   Skin: Negative.   Neurological: Negative.   Endo/Heme/Allergies: Negative.   Psychiatric/Behavioral: Negative.      Objective:   Vitals:   09/14/16 1410  BP: 122/68  Pulse: 100  Resp: (!) 26          Physical Exam  Constitutional: He is well-developed, well-nourished, and in no distress.  HENT:  Head: Normocephalic.  Right Ear: Tympanic membrane, external ear and ear canal normal.  Left Ear: Tympanic membrane, external ear and ear canal normal.  Nose: Nose normal. No mucosal edema or rhinorrhea.  Mouth/Throat: Uvula is midline,  oropharynx is clear and moist and mucous membranes are normal. No oropharyngeal exudate.  Eyes: Conjunctivae are normal.  Neck: Trachea normal. No tracheal tenderness present. No tracheal deviation present. No thyromegaly present.  Cardiovascular: Normal rate, regular rhythm, S1 normal, S2 normal and normal heart sounds.   No murmur heard. Pulmonary/Chest: Breath sounds normal. No stridor. No respiratory distress. He has no wheezes. He has no rales.  Musculoskeletal: He exhibits no edema.  Lymphadenopathy:       Head (right side): No tonsillar adenopathy present.       Head (left side): No tonsillar adenopathy present.    He has no cervical adenopathy.  Neurological: He is alert. Gait normal.  Skin: No rash noted. He is not diaphoretic. No erythema. Nails show no clubbing.  Psychiatric: Mood and affect normal.    Diagnostics: Results of a chest x-ray dated 08/26/2016 identify the following:  There are mild left retrocardiac opacities. Cardiac and new subtle contours are stable. There is no pleural effusion or pneumothorax. Degenerative changes are seen in the spine.   Spirometry was performed and demonstrated an FEV1 of 3.33 at 110 % of predicted.  Assessment and Plan:   1. Moderate persistent asthma, uncomplicated   2. Allergic rhinoconjunctivitis   3. Gastroesophageal reflux disease, esophagitis presence not specified     1. Continue Dulera 200 - 2 inhalations twice a day (samples)  2. Add Qvar 80 - 2 inhalations twice a day to [facility] while "sick" (samples)  3. Continue montelukast 10 mg daily  4. Continue Qnasl or Dymista one spray each nostril 1-2 time per day  5. Continue Xolair and EpiPen  6. Continue ProAir HFA or albuterol nebulization if needed  7. Return to clinic in 6 months or earlier if problem  I will assume that once Kay[place] restarts his Ruthe Mannan he will resolve his requirement for short acting bronchodilator. We'll continue to have him use anti-inflammatory agents  for his entire respiratory tract as noted above and he will continue on omalizumab injections. I will see him back in this clinic in 6 months or earlier if there is a problem.  Overall he appears to be done very well over the course the past 6 months other than that single exacerbation of his asthma which appeared to be precipitated by a viral respiratory tract infection.   Allena Katz, MD [place]

## 2016-09-15 ENCOUNTER — Encounter: Payer: Self-pay | Admitting: Allergy and Immunology

## 2016-09-17 ENCOUNTER — Ambulatory Visit: Payer: Commercial Managed Care - PPO

## 2016-09-20 ENCOUNTER — Ambulatory Visit (INDEPENDENT_AMBULATORY_CARE_PROVIDER_SITE_OTHER): Payer: Commercial Managed Care - PPO | Admitting: *Deleted

## 2016-09-20 DIAGNOSIS — J4540 Moderate persistent asthma, uncomplicated: Secondary | ICD-10-CM

## 2016-09-28 ENCOUNTER — Telehealth: Payer: Self-pay | Admitting: Allergy

## 2016-09-28 NOTE — Telephone Encounter (Signed)
Patient called and wanted to talk to you about his bill. Patient phone number [phone]

## 2016-09-29 NOTE — Telephone Encounter (Signed)
Will pay $100/mo beginning tomorrow

## 2016-09-29 NOTE — Telephone Encounter (Signed)
09-28-16 left message that I would call back 09-29-16 left message to call me back at his convenience

## 2016-10-01 ENCOUNTER — Ambulatory Visit (INDEPENDENT_AMBULATORY_CARE_PROVIDER_SITE_OTHER): Payer: Commercial Managed Care - PPO

## 2016-10-01 DIAGNOSIS — J4540 Moderate persistent asthma, uncomplicated: Secondary | ICD-10-CM

## 2016-10-05 ENCOUNTER — Other Ambulatory Visit: Payer: Self-pay | Admitting: Allergy and Immunology

## 2016-10-15 ENCOUNTER — Ambulatory Visit (INDEPENDENT_AMBULATORY_CARE_PROVIDER_SITE_OTHER): Payer: Commercial Managed Care - PPO

## 2016-10-15 DIAGNOSIS — J4540 Moderate persistent asthma, uncomplicated: Secondary | ICD-10-CM

## 2016-10-29 ENCOUNTER — Ambulatory Visit (INDEPENDENT_AMBULATORY_CARE_PROVIDER_SITE_OTHER): Payer: Commercial Managed Care - PPO | Admitting: *Deleted

## 2016-10-29 DIAGNOSIS — J4540 Moderate persistent asthma, uncomplicated: Secondary | ICD-10-CM

## 2016-11-11 ENCOUNTER — Ambulatory Visit (INDEPENDENT_AMBULATORY_CARE_PROVIDER_SITE_OTHER): Payer: Commercial Managed Care - PPO | Admitting: *Deleted

## 2016-11-11 DIAGNOSIS — J4540 Moderate persistent asthma, uncomplicated: Secondary | ICD-10-CM

## 2016-11-22 ENCOUNTER — Other Ambulatory Visit: Payer: Self-pay | Admitting: Allergy and Immunology

## 2016-11-23 ENCOUNTER — Ambulatory Visit (INDEPENDENT_AMBULATORY_CARE_PROVIDER_SITE_OTHER): Payer: Commercial Managed Care - PPO | Admitting: Allergy and Immunology

## 2016-11-23 ENCOUNTER — Encounter: Payer: Self-pay | Admitting: Allergy and Immunology

## 2016-11-23 VITALS — BP 130/82 | HR 88 | Resp 24

## 2016-11-23 DIAGNOSIS — J309 Allergic rhinitis, unspecified: Secondary | ICD-10-CM

## 2016-11-23 DIAGNOSIS — K219 Gastro-esophageal reflux disease without esophagitis: Secondary | ICD-10-CM | POA: Diagnosis not present

## 2016-11-23 DIAGNOSIS — J4541 Moderate persistent asthma with (acute) exacerbation: Secondary | ICD-10-CM

## 2016-11-23 DIAGNOSIS — H1010 Acute atopic conjunctivitis, unspecified eye: Secondary | ICD-10-CM | POA: Diagnosis not present

## 2016-11-23 MED ORDER — ALBUTEROL SULFATE (2.5 MG/3ML) 0.083% IN NEBU: INHALATION_SOLUTION | RESPIRATORY_TRACT | 1 refills | Status: DC

## 2016-11-23 MED ORDER — METHYLPREDNISOLONE ACETATE 80 MG/ML IJ SUSP
80.0000 mg | Freq: Once | INTRAMUSCULAR | Status: AC
  Administered 2016-11-23: 80 mg via INTRAMUSCULAR

## 2016-11-23 NOTE — Patient Instructions (Addendum)
  1. Continue Dulera 200 - 2 inhalations twice a day (samples)  2. Add Qvar 80 REDIHALER- 2 inhalations twice a day to [facility] while "sick" (samples)  3. Continue montelukast 10 mg daily  4. Continue Qnasl or Dymista one spray each nostril 1-2 time per day  5. Continue Xolair and EpiPen  6. Continue ProAir HFA or albuterol nebulization if needed  7. Depo-Medrol 80 IM delivered in clinic today  8. Check CBC with differential. Possible change from Xolair to benralizumab?  9. Continue omeprazole 20 mg twice a day  10. Return to clinic in 6 months or earlier if problem

## 2016-11-23 NOTE — Progress Notes (Signed)
Follow-up Note  Referring Provider: No ref. provider found Primary Provider: No PCP Per Patient Date of Office Visit: 11/23/2016  Subjective:   Preston Taylor (DOB: 10/17/1965) is a 51 y.o. male who returns to the [facility] [facility] on 11/23/2016 in re-evaluation of the following:  HPI: Preston Taylor presents to this clinic in evaluation of his asthma treated with Xolair and allergic rhinitis and reflux. I last saw him in this clinic in January 2018.  He has once again developed significant problems with chest tightness and using a bronchodilator multiple times per day. He still gets relief from his bronchodilator but only transiently. This is happening while he has continued to use a large collection of medical therapy including inhaled steroids and a leukotriene modifier and Xolair. His nose has actually been doing very well. He has not had any issues suggesting an episode of sinusitis. He has not required an antibiotic since last being seen in this clinic. He has not had any problems with his reflux. There is not really been any significant environmental change that gives rise to this recent asthma activity.  Allergies as of 11/23/2016      Reactions   Lisinopril Nausea And Vomiting      Medication List      albuterol 108 (90 Base) MCG/ACT inhaler Commonly known as:  PROAIR HFA Inhale two puffs every four to six hours as needed for cough or wheeze.   albuterol (2.5 MG/3ML) 0.083% nebulizer solution Commonly known as:  PROVENTIL USE 1 VIAL VIA NEBULIZER EVERY 4 TO 6 HOURS AS NEEDED FOR COUGH OR WHEEZING   amLODipine 10 MG tablet Commonly known as:  NORVASC   Azelastine-Fluticasone 137-50 MCG/ACT Susp Commonly known as:  DYMISTA Use one spray in each nostril one to two times daily as directed.   beclomethasone 80 MCG/ACT inhaler Commonly known as:  QVAR Inhale two puffs twice daily during asthma flare-up.  Rinse, gargle, and spit after use.   EPIPEN 2-PAK 0.3 mg/0.3 mL Soaj  injection Generic drug:  EPINEPHrine Inject 0.3 mg into the muscle once.   mometasone-formoterol 200-5 MCG/ACT Aero Commonly known as:  DULERA Inhale two puffs twice daily to prevent cough or wheeze.  Rinse, gargle and spit after use.   montelukast 10 MG tablet Commonly known as:  SINGULAIR Take one tablet once daily as directed   omeprazole 20 MG capsule Commonly known as:  PRILOSEC Take 1 capsule (20 mg total) by mouth 2 (two) times daily.   simvastatin 10 MG tablet Commonly known as:  ZOCOR   XOLAIR 150 MG injection Generic drug:  omalizumab MIX AND INJECT 375 MG UNDER THE SKIN EVERY TWO WEEKS       Past Medical History:  Diagnosis Date  . Allergic rhinitis   . Asthma     History reviewed. No pertinent surgical history.  Review of systems negative except as noted in HPI / PMHx or noted below:  Review of Systems  Constitutional: Negative.   HENT: Negative.   Eyes: Negative.   Respiratory: Negative.   Cardiovascular: Negative.   Gastrointestinal: Negative.   Genitourinary: Negative.   Musculoskeletal: Negative.   Skin: Negative.   Neurological: Negative.   Endo/Heme/Allergies: Negative.   Psychiatric/Behavioral: Negative.      Objective:   Vitals:   11/23/16 1519  BP: 130/82  Pulse: 88  Resp: (!) 24          Physical Exam  Constitutional: He is well-developed, well-nourished, and in no distress.  HENT:  Head: Normocephalic.  Right Ear: Tympanic membrane, external ear and ear canal normal.  Left Ear: Tympanic membrane, external ear and ear canal normal.  Nose: Nose normal. No mucosal edema or rhinorrhea.  Mouth/Throat: Uvula is midline, oropharynx is clear and moist and mucous membranes are normal. No oropharyngeal exudate.  Eyes: Conjunctivae are normal.  Neck: Trachea normal. No tracheal tenderness present. No tracheal deviation present. No thyromegaly present.  Cardiovascular: Normal rate, regular rhythm, S1 normal, S2 normal and normal  heart sounds.   No murmur heard. Pulmonary/Chest: Breath sounds normal. No stridor. No respiratory distress. He has no wheezes. He has no rales.  Musculoskeletal: He exhibits no edema.  Lymphadenopathy:       Head (right side): No tonsillar adenopathy present.       Head (left side): No tonsillar adenopathy present.    He has no cervical adenopathy.  Neurological: He is alert. Gait normal.  Skin: No rash noted. He is not diaphoretic. No erythema. Nails show no clubbing.  Psychiatric: Mood and affect normal.    Diagnostics:    Spirometry was performed and demonstrated an FEV1 of 2.91 at 87 % of predicted.  Assessment and Plan:   1. Asthma, not well controlled, moderate persistent, with acute exacerbation   2. Allergic rhinoconjunctivitis   3. Gastroesophageal reflux disease, esophagitis presence not specified     1. Continue Dulera 200 - 2 inhalations twice a day (samples)  2. Add Qvar 80 REDIHALER- 2 inhalations twice a day to [facility] while "sick" (samples)  3. Continue montelukast 10 mg daily  4. Continue Dymista one spray each nostril 1-2 time per day  5. Continue Xolair and EpiPen  6. Continue ProAir HFA or albuterol nebulization if needed  7. Depo-Medrol 80 IM delivered in clinic today  8. Check CBC with differential. Possible change from Xolair to benralizumab?  9. Continue omeprazole 20 mg twice a day  10. Return to clinic in 6 months or earlier if problem  [name] once again use a systemic steroid to treat what appears to be inflammation of his respiratory tract. Given the fact that he has required several systemic steroids while using Xolair we need to consider the possibility that he would benefit from a different biological agent and we will see if he does have eosinophilia by checking a CBC with differential. If so it may be best to have him use an anti-[place] 5 biological agent to replace his Xolair. I'll contact him with the results of his blood tests once it's  available for review. He will continue to use a large collection of medical therapy directed against respiratory tract inflammation as noted above and he will continue on his therapy for reflux as well.  Allena Katz, MD Allergy / Immunology [place]

## 2016-11-24 LAB — CBC WITH DIFFERENTIAL/PLATELET
BASOS: 1 %
Basophils Absolute: 0.1 10*3/uL (ref 0.0–0.2)
EOS (ABSOLUTE): 1.9 10*3/uL — ABNORMAL HIGH (ref 0.0–0.4)
EOS: 21 %
HEMATOCRIT: 48.8 % (ref 37.5–51.0)
HEMOGLOBIN: 16.4 g/dL (ref 13.0–17.7)
Immature Grans (Abs): 0 10*3/uL (ref 0.0–0.1)
Immature Granulocytes: 0 %
LYMPHS ABS: 2.4 10*3/uL (ref 0.7–3.1)
Lymphs: 28 %
MCH: 28 pg (ref 26.6–33.0)
MCHC: 33.6 g/dL (ref 31.5–35.7)
MCV: 83 fL (ref 79–97)
MONOCYTES: 7 %
MONOS ABS: 0.7 10*3/uL (ref 0.1–0.9)
NEUTROS ABS: 3.7 10*3/uL (ref 1.4–7.0)
Neutrophils: 43 %
Platelets: 236 10*3/uL (ref 150–379)
RBC: 5.86 x10E6/uL — ABNORMAL HIGH (ref 4.14–5.80)
RDW: 13.8 % (ref 12.3–15.4)
WBC: 8.8 10*3/uL (ref 3.4–10.8)

## 2016-11-26 ENCOUNTER — Ambulatory Visit (INDEPENDENT_AMBULATORY_CARE_PROVIDER_SITE_OTHER): Payer: Commercial Managed Care - PPO | Admitting: *Deleted

## 2016-11-26 DIAGNOSIS — J4540 Moderate persistent asthma, uncomplicated: Secondary | ICD-10-CM

## 2016-12-10 ENCOUNTER — Ambulatory Visit (INDEPENDENT_AMBULATORY_CARE_PROVIDER_SITE_OTHER): Payer: Commercial Managed Care - PPO

## 2016-12-10 DIAGNOSIS — J4540 Moderate persistent asthma, uncomplicated: Secondary | ICD-10-CM | POA: Diagnosis not present

## 2016-12-10 MED ORDER — BENRALIZUMAB 30 MG/ML ~~LOC~~ SOSY
30.0000 mg | PREFILLED_SYRINGE | Freq: Once | SUBCUTANEOUS | Status: AC
  Administered 2016-12-10: 30 mg via SUBCUTANEOUS

## 2017-01-05 ENCOUNTER — Other Ambulatory Visit: Payer: Self-pay | Admitting: Allergy and Immunology

## 2017-01-07 ENCOUNTER — Ambulatory Visit (INDEPENDENT_AMBULATORY_CARE_PROVIDER_SITE_OTHER): Payer: Commercial Managed Care - PPO

## 2017-01-07 DIAGNOSIS — J4550 Severe persistent asthma, uncomplicated: Secondary | ICD-10-CM | POA: Diagnosis not present

## 2017-01-17 ENCOUNTER — Other Ambulatory Visit: Payer: Self-pay | Admitting: Allergy and Immunology

## 2017-01-17 NOTE — Telephone Encounter (Signed)
Called patient to find out how often he was using the neb. Patient stated he was using it 3 times at night and once in the morning. Patient stated he has been doing this for the last 2 weeks. He has had shortness of breath and chest tighten. Patient is coming in 01/19/2017 at 3:15 with Dr. Nelva Bush.

## 2017-01-17 NOTE — Telephone Encounter (Signed)
Patient is requesting a refill for his albuteral solution for his nebulizer. Pharmacy is [facility] on [address].

## 2017-01-19 ENCOUNTER — Ambulatory Visit (INDEPENDENT_AMBULATORY_CARE_PROVIDER_SITE_OTHER): Payer: Commercial Managed Care - PPO | Admitting: Allergy

## 2017-01-19 ENCOUNTER — Encounter: Payer: Self-pay | Admitting: Allergy

## 2017-01-19 VITALS — BP 140/86 | HR 84 | Resp 20

## 2017-01-19 DIAGNOSIS — J309 Allergic rhinitis, unspecified: Secondary | ICD-10-CM

## 2017-01-19 DIAGNOSIS — H1010 Acute atopic conjunctivitis, unspecified eye: Secondary | ICD-10-CM | POA: Diagnosis not present

## 2017-01-19 DIAGNOSIS — J4541 Moderate persistent asthma with (acute) exacerbation: Secondary | ICD-10-CM

## 2017-01-19 DIAGNOSIS — K219 Gastro-esophageal reflux disease without esophagitis: Secondary | ICD-10-CM

## 2017-01-19 MED ORDER — MOMETASONE FURO-FORMOTEROL FUM 200-5 MCG/ACT IN AERO: INHALATION_SPRAY | RESPIRATORY_TRACT | 5 refills | Status: DC

## 2017-01-19 MED ORDER — ALBUTEROL SULFATE HFA 108 (90 BASE) MCG/ACT IN AERS
INHALATION_SPRAY | RESPIRATORY_TRACT | 1 refills | Status: DC
Start: 2017-01-19 — End: 2017-10-19

## 2017-01-19 MED ORDER — IPRATROPIUM-ALBUTEROL 0.5-2.5 (3) MG/3ML IN SOLN: 3.0000 mL | Freq: Four times a day (QID) | RESPIRATORY_TRACT | 1 refills | Status: DC | PRN

## 2017-01-19 MED ORDER — BECLOMETHASONE DIPROP HFA 80 MCG/ACT IN AERB: 2.0000 | INHALATION_SPRAY | Freq: Two times a day (BID) | RESPIRATORY_TRACT | 5 refills | Status: DC

## 2017-01-19 NOTE — Patient Instructions (Addendum)
  1. Continue Dulera 200 - 2 inhalations twice a day   2. Add Qvar 80 REDIHALER- 2 inhalations twice a day to [facility] while "sick"  3. Continue montelukast 10 mg daily  4. Continue Qnasl or Dymista one spray each nostril 1-2 time per day  5. Continue Fasenra and EpiPen  6. Continue ProAir HFA or albuterol/duoneb nebulization if needed  7. Take prednisone pack 20mg  twice a day x 3 days, 20 mg x 2 days then 10mg  x 1 day then stop.    8. Continue omeprazole 20 mg twice a day  9. Return to clinic in 4-6 months or earlier if problem

## 2017-01-19 NOTE — Progress Notes (Signed)
Follow-up Note  RE: Preston Taylor MRN: [id number] DOB: [birth year] Date of Office Visit: 01/19/2017   History of present illness: Preston Taylor is a 51 y.o. male presenting today for  sick visit.   He was last seen in the office on 11/23/2016 by Dr. Neldon Mc at which time he had an acute exacerbation of his asthma and was given a Depo-Medrol injection in the office and advised to take [facility] as well as Qvar.  He was also changed from [place] to [place] since his last visit.   Over the past 2 and a half weeks he reports he has been having mostly chest tightness primarily and some cough. He reports that he has been needing to use his albuterol 5-6 times a day and when he gets home he will then use his nebulizer at least 1-2 more times to help relieve his tightness. He states the albuterol has only been lasting about 20-30 minutes max. He has been using his albuterol so much that he has run out.  He also ran out of the Qvar prior to his current symptoms as he has not been on this medication to help control his exacerbation. He denies any preceding illness and no fevers. He is unsure of what has triggered his symptoms currently. He reports today in the office he is feeling very tight and request a nebulizer treatment.  Review of systems: Review of Systems  Constitutional: Negative for chills, fever and malaise/fatigue.  HENT: Negative for congestion, ear discharge, ear pain, nosebleeds, sinus pain, sore throat and tinnitus.   Eyes: Negative for discharge and redness.  Respiratory: Positive for cough. Negative for shortness of breath and wheezing.   Cardiovascular: Positive for chest pain (Tightness).  Gastrointestinal: Negative for abdominal pain, constipation, diarrhea, heartburn, nausea and vomiting.  Musculoskeletal: Negative for joint pain and myalgias.  Skin: Negative for itching and rash.  Neurological: Negative for headaches.    All other systems negative unless noted above in HPI  Past  medical/social/surgical/family history have been reviewed and are unchanged unless specifically indicated below.  No changes  Medication List: Allergies as of 01/19/2017      Reactions   Lisinopril Nausea And Vomiting      Medication List       Accurate as of 01/19/17  4:35 PM. Always use your most recent med list.          albuterol (2.5 MG/3ML) 0.083% nebulizer solution Commonly known as:  PROVENTIL Use one vial in the nebulizer every 4-6 hours if needed for cough or wheeze   albuterol 108 (90 Base) MCG/ACT inhaler Commonly known as:  PROAIR HFA Inhale two puffs every four to six hours as needed for cough or wheeze.   amLODipine 10 MG tablet Commonly known as:  NORVASC   Azelastine-Fluticasone 137-50 MCG/ACT Susp Commonly known as:  DYMISTA Use one spray in each nostril one to two times daily as directed.   beclomethasone 80 MCG/ACT inhaler Commonly known as:  QVAR Inhale two puffs twice daily during asthma flare-up.  Rinse, gargle, and spit after use.   beclomethasone 80 MCG/ACT inhaler Commonly known as:  QVAR REDIHALER Inhale 2 puffs into the lungs 2 (two) times daily.   EPIPEN 2-PAK 0.3 mg/0.3 mL Soaj injection Generic drug:  EPINEPHrine Inject 0.3 mg into the muscle once.   FASENRA 30 MG/ML Sosy Generic drug:  Benralizumab INJECT 30 MG (1 SYRINGE) UNDER THE SKIN AT WEEKS 0, 4, AND 8 FOLLOWED BY EVERY 8 WEEKS THEREAFTER  ipratropium-albuterol 0.5-2.5 (3) MG/3ML Soln Commonly known as:  DUONEB Take 3 mLs by nebulization every 6 (six) hours as needed.   mometasone-formoterol 200-5 MCG/ACT Aero Commonly known as:  DULERA Inhale two puffs twice daily to prevent cough or wheeze.  Rinse, gargle and spit after use.   montelukast 10 MG tablet Commonly known as:  SINGULAIR Take one tablet once daily as directed   omeprazole 20 MG capsule Commonly known as:  PRILOSEC Take 1 capsule (20 mg total) by mouth 2 (two) times daily.   simvastatin 10 MG  tablet Commonly known as:  ZOCOR   XOLAIR 150 MG injection Generic drug:  omalizumab MIX AND INJECT 375 MG UNDER THE SKIN EVERY TWO WEEKS       Known medication allergies: Allergies  Allergen Reactions  . Lisinopril Nausea And Vomiting     Physical examination: Blood pressure 140/86, pulse 84, resp. rate 20.  General: Alert, interactive, in no acute distress. HEENT: PERRLA, TMs pearly gray, turbinates minimally edematous without discharge, post-pharynx non erythematous. Neck: Supple without lymphadenopathy. Lungs: Mildly decreased breath sounds bilaterally without wheezing, rhonchi or rales. {no increased work of breathing.  Increased aeration following DuoNeb administration he also reports chest tightness is improved. CV: Normal S1, S2 without murmurs. Abdomen: Nondistended, nontender. Skin: Warm and dry, without lesions or rashes. Extremities:  No clubbing, cyanosis or edema. Neuro:   Grossly intact.  Diagnositics/Labs:  Spirometry: FEV1: 2.93L  102%, FVC: 3.21L  92%, ratio consistent with Nonobstructive pattern  Assessment and plan:   Moderate persistent asthma with acute exacerbation   - It appears he is having another exacerbation at this time with subjective complaint of chest tightness that is relieved with DuoNeb in the office today.  He has been using his albuterol inhaler and nebulizer many times throughout the day for the past one half weeks due to his symptoms.    - Continue Dulera 202 puffs twice a day    - During asthma flares add on Qvar 80 Redihaler 2 puffs twice a day for "asthma action plan "    - Continue Singulair 10 mg daily    - We'll treat with prednisone 20 mg twice a day x3 days then 20 mg daily 2 days then 10 mg daily 1 day.    - We'll prescribe DuoNeb for home use every 6 hours as needed and Continue albuterol inhaler 2 puffs every 4 hours as needed for cough, wheeze, shortness of breath or chest tightness    - Continue Fasenra and as needed  EpiPen  Allergic rhinoconjunctivitis  - Continue Qnasl or Dymista 1 spray each nostril 2 times per day  - Singulair as above  GERD  - Continue omeprazole 20 mg twice a day  Follow-up in 4-6 months or sooner if issues with Dr. Neldon Mc  I appreciate the opportunity to take part in Deaire's care. Please do not hesitate to contact me with questions.  Sincerely,   Prudy Feeler, MD Allergy/Immunology [facility] [place]

## 2017-01-21 ENCOUNTER — Telehealth: Payer: Self-pay | Admitting: Allergy

## 2017-01-21 DIAGNOSIS — J4541 Moderate persistent asthma with (acute) exacerbation: Secondary | ICD-10-CM | POA: Diagnosis not present

## 2017-01-21 NOTE — Telephone Encounter (Signed)
I have spoken with patient and he is going to come to [place] to pick one up today.

## 2017-01-21 NOTE — Telephone Encounter (Signed)
Pharmacy called and Preston Taylor needs a nebulizer, his is broken and he needs it right away.

## 2017-01-25 ENCOUNTER — Telehealth: Payer: Self-pay | Admitting: *Deleted

## 2017-01-25 ENCOUNTER — Encounter: Payer: Self-pay | Admitting: Allergy and Immunology

## 2017-01-25 ENCOUNTER — Ambulatory Visit (INDEPENDENT_AMBULATORY_CARE_PROVIDER_SITE_OTHER): Payer: Commercial Managed Care - PPO | Admitting: Allergy and Immunology

## 2017-01-25 VITALS — BP 140/88 | HR 96 | Resp 60

## 2017-01-25 DIAGNOSIS — J4540 Moderate persistent asthma, uncomplicated: Secondary | ICD-10-CM

## 2017-01-25 DIAGNOSIS — K219 Gastro-esophageal reflux disease without esophagitis: Secondary | ICD-10-CM | POA: Diagnosis not present

## 2017-01-25 DIAGNOSIS — J301 Allergic rhinitis due to pollen: Secondary | ICD-10-CM | POA: Diagnosis not present

## 2017-01-25 NOTE — Patient Instructions (Addendum)
  1. Continue Dulera 200 - 2 inhalations twice a day (samples)  2. Add Qvar 80 REDIHALER- 2 inhalations twice a day to [facility] while "sick" (samples)  3. Continue montelukast 10 mg daily  4. Continue Qnasl or Dymista one spray each nostril 1-2 time per day  5. Continue ProAir HFA or albuterol nebulization if needed  6. Continue omeprazole 20 mg twice a day  7. Continue benralizumab injections for three months  8. Return to clinic in 4 weeks or earlier if problem

## 2017-01-25 NOTE — Telephone Encounter (Signed)
Patient called states he finished prednisone on Sunday 01/23/17 and has been having trouble breathing since per Dr Lyndon Code nurses ok to bring him in @ 3pm today to be worked in. Patient advised

## 2017-01-25 NOTE — Progress Notes (Signed)
Follow-up Note  Referring Provider: No ref. provider found Primary Provider: Patient, No Pcp Per Date of Office Visit: 01/25/2017  Subjective:   Preston Taylor (DOB: 15-Aug-1966) is a 51 y.o. male who returns to the [facility] on 01/25/2017 in re-evaluation of the following:  HPI: Preston Taylor returns to this clinic in reevaluation of his asthma and history of allergic rhinitis and reflux-induced respiratory disease. Preston Taylor was last seen in this clinic by Dr. Nelva Bush on 01/19/2017 at which time Preston Taylor received systemic steroids for what appeared to be a exacerbation of his asthma. Although Preston Taylor is somewhat better Preston Taylor still continues to have some intermittent chest tightness and some shortness of breath and uses a bronchodilator a few times per week. Fortunately, Preston Taylor does appear to receive some relief from the use of his short acting bronchodilator. Preston Taylor has not added Qvar into his Dulera at this point in time.  Preston Taylor has received 2 injections of benralizumab so far to replace his Xolair.  Preston Taylor has had no problems with reflux. Overall his nose appears to be doing relatively well at this point.  Allergies as of 01/25/2017      Reactions   Lisinopril Nausea And Vomiting      Medication List      albuterol (2.5 MG/3ML) 0.083% nebulizer solution Commonly known as:  PROVENTIL Use one vial in the nebulizer every 4-6 hours if needed for cough or wheeze   albuterol 108 (90 Base) MCG/ACT inhaler Commonly known as:  PROAIR HFA Inhale two puffs every four to six hours as needed for cough or wheeze.   amLODipine 10 MG tablet Commonly known as:  NORVASC   Azelastine-Fluticasone 137-50 MCG/ACT Susp Commonly known as:  DYMISTA Use one spray in each nostril one to two times daily as directed.   beclomethasone 80 MCG/ACT inhaler Commonly known as:  QVAR REDIHALER Inhale 2 puffs into the lungs 2 (two) times daily.   EPIPEN 2-PAK 0.3 mg/0.3 mL Soaj injection Generic drug:  EPINEPHrine Inject 0.3 mg into the  muscle once.   FASENRA 30 MG/ML Sosy Generic drug:  Benralizumab INJECT 30 MG (1 SYRINGE) UNDER THE SKIN AT WEEKS 0, 4, AND 8 FOLLOWED BY EVERY 8 WEEKS THEREAFTER   ipratropium-albuterol 0.5-2.5 (3) MG/3ML Soln Commonly known as:  DUONEB Take 3 mLs by nebulization every 6 (six) hours as needed.   mometasone-formoterol 200-5 MCG/ACT Aero Commonly known as:  DULERA Inhale two puffs twice daily to prevent cough or wheeze.  Rinse, gargle and spit after use.   montelukast 10 MG tablet Commonly known as:  SINGULAIR Take one tablet once daily as directed   omeprazole 20 MG capsule Commonly known as:  PRILOSEC Take 1 capsule (20 mg total) by mouth 2 (two) times daily.   simvastatin 10 MG tablet Commonly known as:  ZOCOR       Past Medical History:  Diagnosis Date  . Allergic rhinitis   . Asthma     No past surgical history on file.  Review of systems negative except as noted in HPI / PMHx or noted below:  Review of Systems  Constitutional: Negative.   HENT: Negative.   Eyes: Negative.   Respiratory: Negative.   Cardiovascular: Negative.   Gastrointestinal: Negative.   Genitourinary: Negative.   Musculoskeletal: Negative.   Skin: Negative.   Neurological: Negative.   Endo/Heme/Allergies: Negative.   Psychiatric/Behavioral: Negative.      Objective:   Vitals:   01/25/17 1514  BP: 140/88  Pulse:  96  Resp: (!) 60          Physical Exam  Constitutional: Preston Taylor is well-developed, well-nourished, and in no distress.  HENT:  Head: Normocephalic.  Right Ear: Tympanic membrane, external ear and ear canal normal.  Left Ear: Tympanic membrane, external ear and ear canal normal.  Nose: Nose normal. No mucosal edema or rhinorrhea.  Mouth/Throat: Uvula is midline, oropharynx is clear and moist and mucous membranes are normal. No oropharyngeal exudate.  Eyes: Conjunctivae are normal.  Neck: Trachea normal. No tracheal tenderness present. No tracheal deviation present. No  thyromegaly present.  Cardiovascular: Normal rate, regular rhythm, S1 normal, S2 normal and normal heart sounds.   No murmur heard. Pulmonary/Chest: Breath sounds normal. No stridor. No respiratory distress. Preston Taylor has no wheezes. Preston Taylor has no rales.  Musculoskeletal: Preston Taylor exhibits no edema.  Lymphadenopathy:       Head (right side): No tonsillar adenopathy present.       Head (left side): No tonsillar adenopathy present.    Preston Taylor has no cervical adenopathy.  Neurological: Preston Taylor is alert. Gait normal.  Skin: No rash noted. Preston Taylor is not diaphoretic. No erythema. Nails show no clubbing.  Psychiatric: Mood and affect normal.    Diagnostics:    Spirometry was performed and demonstrated an FEV1 of 2.92 at 88 % of predicted.  The patient had an Asthma Control Test with the following results:  .    Assessment and Plan:   1. Not well controlled moderate persistent asthma   2. Non-seasonal allergic rhinitis due to pollen   3. Gastroesophageal reflux disease, esophagitis presence not specified     1. Continue Dulera 200 - 2 inhalations twice a day (samples)  2. Add Qvar 80 REDIHALER- 2 inhalations twice a day to [facility] while "sick" (samples)  3. Continue montelukast 10 mg daily  4. Continue Qnasl or Dymista one spray each nostril 1-2 time per day  5. Continue ProAir HFA or albuterol nebulization if needed  6. Continue omeprazole 20 mg twice a day  7. Continue benralizumab injections for three months  8. Return to clinic in 4 weeks or earlier if problem  Emmauel is on a large collection of medical therapy in an attempt to address his atopic respiratory disease and at this point time we have marginal control of his condition. We will give him one more injection of anti-IL-5 receptor antagonist and then see him back in this clinic in 4 weeks to assess his response to this approach while Preston Taylor continues to use a large collection medical therapy directed against inflammation and also continues to treat  reflux.  Allena Katz, MD Allergy / Immunology [place]

## 2017-02-13 ENCOUNTER — Other Ambulatory Visit: Payer: Self-pay | Admitting: Allergy and Immunology

## 2017-02-14 ENCOUNTER — Telehealth: Payer: Self-pay | Admitting: *Deleted

## 2017-02-14 NOTE — Telephone Encounter (Signed)
Patient just wanted to let you know that he will call and pay $100 on his account the first Monday of every month. He made his first payment today.

## 2017-02-14 NOTE — Telephone Encounter (Signed)
Made notation  - kt

## 2017-02-21 ENCOUNTER — Other Ambulatory Visit: Payer: Self-pay | Admitting: *Deleted

## 2017-02-21 DIAGNOSIS — J4541 Moderate persistent asthma with (acute) exacerbation: Secondary | ICD-10-CM

## 2017-02-21 MED ORDER — IPRATROPIUM-ALBUTEROL 0.5-2.5 (3) MG/3ML IN SOLN: 3.0000 mL | Freq: Four times a day (QID) | RESPIRATORY_TRACT | 1 refills | Status: DC | PRN

## 2017-02-23 DIAGNOSIS — Z0000 Encounter for general adult medical examination without abnormal findings: Secondary | ICD-10-CM | POA: Diagnosis not present

## 2017-02-23 DIAGNOSIS — K219 Gastro-esophageal reflux disease without esophagitis: Secondary | ICD-10-CM | POA: Diagnosis not present

## 2017-02-23 DIAGNOSIS — I10 Essential (primary) hypertension: Secondary | ICD-10-CM | POA: Diagnosis not present

## 2017-02-23 DIAGNOSIS — E785 Hyperlipidemia, unspecified: Secondary | ICD-10-CM | POA: Diagnosis not present

## 2017-03-04 ENCOUNTER — Ambulatory Visit (INDEPENDENT_AMBULATORY_CARE_PROVIDER_SITE_OTHER): Payer: Commercial Managed Care - PPO

## 2017-03-04 DIAGNOSIS — J4540 Moderate persistent asthma, uncomplicated: Secondary | ICD-10-CM | POA: Diagnosis not present

## 2017-03-04 MED ORDER — BENRALIZUMAB 30 MG/ML ~~LOC~~ SOSY
30.0000 mg | PREFILLED_SYRINGE | Freq: Once | SUBCUTANEOUS | Status: AC
  Administered 2017-03-04: 30 mg via SUBCUTANEOUS

## 2017-03-10 DIAGNOSIS — I10 Essential (primary) hypertension: Secondary | ICD-10-CM | POA: Diagnosis not present

## 2017-03-10 DIAGNOSIS — M545 Low back pain: Secondary | ICD-10-CM | POA: Diagnosis not present

## 2017-03-10 DIAGNOSIS — Z125 Encounter for screening for malignant neoplasm of prostate: Secondary | ICD-10-CM | POA: Diagnosis not present

## 2017-03-24 DIAGNOSIS — Z1211 Encounter for screening for malignant neoplasm of colon: Secondary | ICD-10-CM | POA: Diagnosis not present

## 2017-03-24 DIAGNOSIS — K640 First degree hemorrhoids: Secondary | ICD-10-CM | POA: Diagnosis not present

## 2017-03-24 DIAGNOSIS — D126 Benign neoplasm of colon, unspecified: Secondary | ICD-10-CM | POA: Diagnosis not present

## 2017-04-03 ENCOUNTER — Other Ambulatory Visit: Payer: Self-pay | Admitting: Allergy and Immunology

## 2017-04-03 DIAGNOSIS — J4541 Moderate persistent asthma with (acute) exacerbation: Secondary | ICD-10-CM

## 2017-04-25 ENCOUNTER — Telehealth: Payer: Self-pay | Admitting: Pediatrics

## 2017-04-25 NOTE — Telephone Encounter (Signed)
Please call pt back regarding his concern about letter being sent regardless of monthly payments he has made.

## 2017-04-25 NOTE — Telephone Encounter (Signed)
Called pt twice - left message that I set him up on a payment plan to pay $100/mo on the 10th ov each month - kt

## 2017-04-29 ENCOUNTER — Ambulatory Visit (INDEPENDENT_AMBULATORY_CARE_PROVIDER_SITE_OTHER): Payer: Commercial Managed Care - PPO

## 2017-04-29 DIAGNOSIS — J4550 Severe persistent asthma, uncomplicated: Secondary | ICD-10-CM | POA: Diagnosis not present

## 2017-04-29 DIAGNOSIS — J4540 Moderate persistent asthma, uncomplicated: Secondary | ICD-10-CM

## 2017-04-29 MED ORDER — BENRALIZUMAB 30 MG/ML ~~LOC~~ SOSY
30.0000 mg | PREFILLED_SYRINGE | Freq: Once | SUBCUTANEOUS | Status: AC
  Administered 2017-04-29: 30 mg via SUBCUTANEOUS

## 2017-05-07 ENCOUNTER — Other Ambulatory Visit: Payer: Self-pay | Admitting: Allergy and Immunology

## 2017-05-07 DIAGNOSIS — J4541 Moderate persistent asthma with (acute) exacerbation: Secondary | ICD-10-CM

## 2017-06-24 ENCOUNTER — Ambulatory Visit (INDEPENDENT_AMBULATORY_CARE_PROVIDER_SITE_OTHER): Payer: Commercial Managed Care - PPO

## 2017-06-24 DIAGNOSIS — J4550 Severe persistent asthma, uncomplicated: Secondary | ICD-10-CM | POA: Diagnosis not present

## 2017-06-24 DIAGNOSIS — J4540 Moderate persistent asthma, uncomplicated: Secondary | ICD-10-CM

## 2017-06-24 MED ORDER — BENRALIZUMAB 30 MG/ML ~~LOC~~ SOSY
30.0000 mg | PREFILLED_SYRINGE | SUBCUTANEOUS | Status: AC
  Administered 2017-06-24 – 2024-08-15 (×46): 30 mg via SUBCUTANEOUS

## 2017-07-03 ENCOUNTER — Other Ambulatory Visit: Payer: Self-pay | Admitting: Allergy and Immunology

## 2017-07-14 ENCOUNTER — Other Ambulatory Visit: Payer: Self-pay | Admitting: Allergy and Immunology

## 2017-07-14 DIAGNOSIS — J4541 Moderate persistent asthma with (acute) exacerbation: Secondary | ICD-10-CM

## 2017-08-11 DIAGNOSIS — R5381 Other malaise: Secondary | ICD-10-CM | POA: Diagnosis not present

## 2017-08-11 DIAGNOSIS — M549 Dorsalgia, unspecified: Secondary | ICD-10-CM | POA: Diagnosis not present

## 2017-08-11 DIAGNOSIS — G44209 Tension-type headache, unspecified, not intractable: Secondary | ICD-10-CM | POA: Diagnosis not present

## 2017-08-15 ENCOUNTER — Other Ambulatory Visit: Payer: Self-pay

## 2017-08-15 DIAGNOSIS — J4541 Moderate persistent asthma with (acute) exacerbation: Secondary | ICD-10-CM

## 2017-08-15 MED ORDER — BECLOMETHASONE DIPROP HFA 80 MCG/ACT IN AERB: 2.0000 | INHALATION_SPRAY | Freq: Two times a day (BID) | RESPIRATORY_TRACT | 0 refills | Status: DC

## 2017-08-19 ENCOUNTER — Ambulatory Visit (INDEPENDENT_AMBULATORY_CARE_PROVIDER_SITE_OTHER): Payer: Commercial Managed Care - PPO

## 2017-08-19 DIAGNOSIS — J4550 Severe persistent asthma, uncomplicated: Secondary | ICD-10-CM

## 2017-08-19 DIAGNOSIS — J4540 Moderate persistent asthma, uncomplicated: Secondary | ICD-10-CM

## 2017-09-12 ENCOUNTER — Other Ambulatory Visit: Payer: Self-pay

## 2017-09-12 ENCOUNTER — Other Ambulatory Visit: Payer: Self-pay | Admitting: Allergy and Immunology

## 2017-09-12 DIAGNOSIS — J4541 Moderate persistent asthma with (acute) exacerbation: Secondary | ICD-10-CM

## 2017-09-12 MED ORDER — BECLOMETHASONE DIPROP HFA 80 MCG/ACT IN AERB: 2.0000 | INHALATION_SPRAY | Freq: Two times a day (BID) | RESPIRATORY_TRACT | 0 refills | Status: DC

## 2017-09-12 NOTE — Telephone Encounter (Signed)
Last refill given for Qvar 35mcg. Pt needs an OV.

## 2017-09-22 ENCOUNTER — Other Ambulatory Visit: Payer: Self-pay | Admitting: Allergy and Immunology

## 2017-09-22 NOTE — Telephone Encounter (Signed)
Courtesy refill  

## 2017-10-10 ENCOUNTER — Other Ambulatory Visit: Payer: Self-pay | Admitting: Allergy and Immunology

## 2017-10-10 DIAGNOSIS — J4541 Moderate persistent asthma with (acute) exacerbation: Secondary | ICD-10-CM

## 2017-10-14 ENCOUNTER — Ambulatory Visit (INDEPENDENT_AMBULATORY_CARE_PROVIDER_SITE_OTHER): Payer: Commercial Managed Care - PPO | Admitting: *Deleted

## 2017-10-14 DIAGNOSIS — J4550 Severe persistent asthma, uncomplicated: Secondary | ICD-10-CM | POA: Diagnosis not present

## 2017-10-19 ENCOUNTER — Ambulatory Visit (INDEPENDENT_AMBULATORY_CARE_PROVIDER_SITE_OTHER): Payer: Commercial Managed Care - PPO | Admitting: Family Medicine

## 2017-10-19 ENCOUNTER — Encounter: Payer: Self-pay | Admitting: Family Medicine

## 2017-10-19 VITALS — BP 148/84 | HR 84 | Resp 16

## 2017-10-19 DIAGNOSIS — J4540 Moderate persistent asthma, uncomplicated: Secondary | ICD-10-CM

## 2017-10-19 DIAGNOSIS — K219 Gastro-esophageal reflux disease without esophagitis: Secondary | ICD-10-CM

## 2017-10-19 DIAGNOSIS — H1010 Acute atopic conjunctivitis, unspecified eye: Secondary | ICD-10-CM

## 2017-10-19 DIAGNOSIS — J309 Allergic rhinitis, unspecified: Secondary | ICD-10-CM

## 2017-10-19 MED ORDER — MOMETASONE FURO-FORMOTEROL FUM 200-5 MCG/ACT IN AERO: INHALATION_SPRAY | RESPIRATORY_TRACT | 5 refills | Status: DC

## 2017-10-19 MED ORDER — AZELASTINE-FLUTICASONE 137-50 MCG/ACT NA SUSP: NASAL | 5 refills | Status: DC

## 2017-10-19 MED ORDER — ALBUTEROL SULFATE HFA 108 (90 BASE) MCG/ACT IN AERS: INHALATION_SPRAY | RESPIRATORY_TRACT | 1 refills | Status: DC

## 2017-10-19 MED ORDER — ALBUTEROL SULFATE (2.5 MG/3ML) 0.083% IN NEBU: INHALATION_SOLUTION | RESPIRATORY_TRACT | 1 refills | Status: DC

## 2017-10-19 MED ORDER — BECLOMETHASONE DIPROP HFA 80 MCG/ACT IN AERB: 2.0000 | INHALATION_SPRAY | Freq: Two times a day (BID) | RESPIRATORY_TRACT | 5 refills | Status: DC

## 2017-10-19 MED ORDER — MONTELUKAST SODIUM 10 MG PO TABS: 10.0000 mg | ORAL_TABLET | Freq: Every day | ORAL | 5 refills | Status: DC

## 2017-10-19 NOTE — Patient Instructions (Addendum)
  1. Continue Dulera 200 - 2 inhalations twice a day with a spacer  2. Add Qvar 80 REDIHALER- 2 inhalations twice a day to [facility] while "sick"     3. Continue montelukast 10 mg daily  4. Continue Qnasl or Dymista one spray each nostril 1-2 time per day  5. Continue ProAir HFA or albuterol nebulization if needed  6. Continue omeprazole 20 mg twice a day  7. Continue benralizumab injections for three months  8. Return to clinic in 4 months or earlier if problem

## 2017-10-19 NOTE — Progress Notes (Signed)
[address] [place] [address]  Dept: [phone]  FOLLOW UP NOTE  Patient ID: Preston Taylor, male    DOB: Feb 08, 1966  Age: 52 y.o. MRN: [id number] Date of Office Visit: 10/19/2017  Assessment  Chief Complaint: Asthma  HPI Preston Taylor is a 52 year old male who presents to the clinic for follow-up today.  He was last seen in this clinic on 01/25/2017 by Dr. Neldon Mc for evaluation of asthma, allergic rhinitis, and gastroesophageal reflux disease.  At that visit, he reported his asthma was somewhat better controlled, however, he was still experiencing intermittent chest tightness and some shortness of breath.  He was using his short acting bronchodilator frequently,at that point, and had received 2 injections of benralizumab which replaced his Xolair injections  At today's visit, he reports his asthma, allergic rhinitis, and gastroesophageal reflux are well controlled.Preston Taylor's asthma has been well controlled. He has not required rescue medication, experienced nocturnal awakenings due to lower respiratory symptoms, nor have activities of daily living been limited. He has required no Emergency Department or Urgent Care visits for his asthma. He has required zero courses of systemic steroids for asthma exacerbations since the last visit.  He is currently using Dulera 200-2 puffs twice a day, Qvar 2 puffs twice a day, and ProAir 2 times a week. He receives his Fasenra injection once every 8 weeks with no side effects.   Allergic rhinitis is reported as well controlled with Dymista.  He has been out of this medication for approximately  One week and has noticed clear drainage from both nostrils.  Gastroesophageal reflux is reported as well controlled with omeprazole daily.  He does report occasional use of Pepto-Bismol for breakthrough heartburn.  His current medications are listed in the chart.  Drug Allergies:  Allergies  Allergen Reactions  . Lisinopril Nausea And Vomiting    Physical  Exam: BP (!) 148/84   Pulse 84   Resp 16    Physical Exam  Constitutional: He is oriented to person, place, and time. He appears well-developed and well-nourished.  HENT:  Right Ear: External ear normal.  Left Ear: External ear normal.  Nose: Nose normal.  Mouth/Throat: Oropharynx is clear and moist.  Eyes: Conjunctivae are normal.  Neck: Normal range of motion. Neck supple.  Cardiovascular: Normal rate, regular rhythm and normal heart sounds.  S1S2 normal. Regular rate and rhythm. No murmur noted.  Pulmonary/Chest: Effort normal and breath sounds normal.  Lungs clear to auscultation  Musculoskeletal: Normal range of motion.  Neurological: He is alert and oriented to person, place, and time.  Skin: Skin is warm and dry.  Psychiatric: He has a normal mood and affect. His behavior is normal.    Diagnostics: FVC 3.27, FEV1 3.03. Predicted FVC 4.11, predicted FEV1 3.30. Spirometry is within the normal range.   Assessment and Plan: 1. Moderate persistent asthma without complication   2. Allergic rhinoconjunctivitis   3. Gastroesophageal reflux disease, esophagitis presence not specified     Meds ordered this encounter  Medications  . albuterol (PROAIR HFA) 108 (90 Base) MCG/ACT inhaler    Sig: INHALE 2 PUFFS BY MOUTH EVERY 4 TO 6 HOURS AS NEEDED FOR COUGH OR WHEEZING    Dispense:  8.5 g    Refill:  1  . albuterol (PROVENTIL) (2.5 MG/3ML) 0.083% nebulizer solution    Sig: Use one vial in the nebulizer every 4-6 hours if needed for cough or wheeze    Dispense:  300 mL    Refill:  1  .  Azelastine-Fluticasone (DYMISTA) 137-50 MCG/ACT SUSP    Sig: Use one spray in each nostril one to two times daily as directed.    Dispense:  1 Bottle    Refill:  5  . beclomethasone (QVAR REDIHALER) 80 MCG/ACT inhaler    Sig: Inhale 2 puffs into the lungs 2 (two) times daily.    Dispense:  10.9 g    Refill:  5  . mometasone-formoterol (DULERA) 200-5 MCG/ACT AERO    Sig: Inhale two puffs  twice daily to prevent cough or wheeze.  Rinse, gargle and spit after use.    Dispense:  13 g    Refill:  5  . montelukast (SINGULAIR) 10 MG tablet    Sig: Take 1 tablet (10 mg total) by mouth daily.    Dispense:  30 tablet    Refill:  5    Patient Instructions   1. Continue Dulera 200 - 2 inhalations twice a day with a spacer  2. Add Qvar 80 REDIHALER- 2 inhalations twice a day to [facility] while "sick"     3. Continue montelukast 10 mg daily  4. Continue Qnasl or Dymista one spray each nostril 1-2 time per day  5. Continue ProAir HFA or albuterol nebulization if needed  6. Continue omeprazole 20 mg twice a day  7. Continue benralizumab injections for three months  8. Return to clinic in 4 months or earlier if problem    Return in about 6 months (around 04/21/2018), or if symptoms worsen or fail to improve.   We discussed using Qvar only during an asthma exacerbation or respiratory infection and Naftula verbalized understanding.   Thank you for the opportunity to care for this patient.  Please do not hesitate to contact me with questions.  Gareth Morgan, FNP [facility] [place]

## 2017-10-20 ENCOUNTER — Other Ambulatory Visit: Payer: Self-pay | Admitting: Allergy and Immunology

## 2017-10-20 DIAGNOSIS — J4541 Moderate persistent asthma with (acute) exacerbation: Secondary | ICD-10-CM

## 2017-11-15 ENCOUNTER — Other Ambulatory Visit: Payer: Self-pay

## 2017-11-15 MED ORDER — ALBUTEROL SULFATE HFA 108 (90 BASE) MCG/ACT IN AERS: INHALATION_SPRAY | RESPIRATORY_TRACT | 1 refills | Status: DC

## 2017-12-09 ENCOUNTER — Ambulatory Visit (INDEPENDENT_AMBULATORY_CARE_PROVIDER_SITE_OTHER): Payer: Commercial Managed Care - PPO

## 2017-12-09 DIAGNOSIS — J4550 Severe persistent asthma, uncomplicated: Secondary | ICD-10-CM

## 2017-12-22 ENCOUNTER — Other Ambulatory Visit: Payer: Self-pay | Admitting: Allergy and Immunology

## 2017-12-22 DIAGNOSIS — J4541 Moderate persistent asthma with (acute) exacerbation: Secondary | ICD-10-CM

## 2018-01-27 ENCOUNTER — Other Ambulatory Visit: Payer: Self-pay | Admitting: Allergy and Immunology

## 2018-01-27 DIAGNOSIS — J4541 Moderate persistent asthma with (acute) exacerbation: Secondary | ICD-10-CM

## 2018-02-03 ENCOUNTER — Ambulatory Visit (INDEPENDENT_AMBULATORY_CARE_PROVIDER_SITE_OTHER): Payer: Commercial Managed Care - PPO

## 2018-02-03 DIAGNOSIS — J4550 Severe persistent asthma, uncomplicated: Secondary | ICD-10-CM | POA: Diagnosis not present

## 2018-02-20 ENCOUNTER — Other Ambulatory Visit: Payer: Self-pay | Admitting: *Deleted

## 2018-02-20 ENCOUNTER — Other Ambulatory Visit: Payer: Self-pay | Admitting: Family Medicine

## 2018-02-20 DIAGNOSIS — J4540 Moderate persistent asthma, uncomplicated: Secondary | ICD-10-CM

## 2018-02-20 DIAGNOSIS — J4541 Moderate persistent asthma with (acute) exacerbation: Secondary | ICD-10-CM

## 2018-02-20 MED ORDER — BECLOMETHASONE DIPROP HFA 80 MCG/ACT IN AERB: 2.0000 | INHALATION_SPRAY | Freq: Two times a day (BID) | RESPIRATORY_TRACT | 1 refills | Status: DC

## 2018-02-20 MED ORDER — OMEPRAZOLE 20 MG PO CPDR: 20.0000 mg | DELAYED_RELEASE_CAPSULE | Freq: Two times a day (BID) | ORAL | 1 refills | Status: DC

## 2018-02-20 MED ORDER — IPRATROPIUM-ALBUTEROL 0.5-2.5 (3) MG/3ML IN SOLN: RESPIRATORY_TRACT | 1 refills | Status: DC

## 2018-02-20 MED ORDER — MOMETASONE FURO-FORMOTEROL FUM 200-5 MCG/ACT IN AERO: INHALATION_SPRAY | RESPIRATORY_TRACT | 1 refills | Status: DC

## 2018-02-20 MED ORDER — MONTELUKAST SODIUM 10 MG PO TABS: 10.0000 mg | ORAL_TABLET | Freq: Every day | ORAL | 1 refills | Status: DC

## 2018-02-20 MED ORDER — BECLOMETHASONE DIPROPIONATE 80 MCG/ACT NA AERS: 2.0000 | INHALATION_SPRAY | NASAL | 1 refills | Status: DC

## 2018-02-20 NOTE — Telephone Encounter (Signed)
[facility]

## 2018-02-20 NOTE — Telephone Encounter (Signed)
Patient wants all medications to be sent to [facility] for 90 days supply Patient wants to switch pharmacies - for ALL medications Any questions - please call patient at [phone]

## 2018-02-21 DIAGNOSIS — I10 Essential (primary) hypertension: Secondary | ICD-10-CM | POA: Diagnosis not present

## 2018-02-21 DIAGNOSIS — J45909 Unspecified asthma, uncomplicated: Secondary | ICD-10-CM | POA: Diagnosis not present

## 2018-02-21 DIAGNOSIS — K219 Gastro-esophageal reflux disease without esophagitis: Secondary | ICD-10-CM | POA: Diagnosis not present

## 2018-02-21 DIAGNOSIS — E785 Hyperlipidemia, unspecified: Secondary | ICD-10-CM | POA: Diagnosis not present

## 2018-02-24 ENCOUNTER — Other Ambulatory Visit: Payer: Self-pay

## 2018-02-24 DIAGNOSIS — J4541 Moderate persistent asthma with (acute) exacerbation: Secondary | ICD-10-CM

## 2018-02-24 MED ORDER — IPRATROPIUM-ALBUTEROL 0.5-2.5 (3) MG/3ML IN SOLN
RESPIRATORY_TRACT | 1 refills | Status: DC
Start: 2018-02-24 — End: 2018-09-04

## 2018-02-24 NOTE — Telephone Encounter (Signed)
Refill sent in for Ipratropium-albuterol

## 2018-03-19 ENCOUNTER — Other Ambulatory Visit: Payer: Self-pay | Admitting: Family Medicine

## 2018-03-19 DIAGNOSIS — J4540 Moderate persistent asthma, uncomplicated: Secondary | ICD-10-CM

## 2018-03-20 ENCOUNTER — Other Ambulatory Visit: Payer: Self-pay

## 2018-03-20 DIAGNOSIS — J4540 Moderate persistent asthma, uncomplicated: Secondary | ICD-10-CM

## 2018-03-20 MED ORDER — BECLOMETHASONE DIPROP HFA 80 MCG/ACT IN AERB
2.0000 | INHALATION_SPRAY | Freq: Two times a day (BID) | RESPIRATORY_TRACT | 1 refills | Status: DC
Start: 2018-03-20 — End: 2018-04-18

## 2018-03-31 ENCOUNTER — Ambulatory Visit: Payer: Self-pay

## 2018-04-18 ENCOUNTER — Ambulatory Visit (INDEPENDENT_AMBULATORY_CARE_PROVIDER_SITE_OTHER): Payer: Commercial Managed Care - PPO | Admitting: Allergy and Immunology

## 2018-04-18 ENCOUNTER — Encounter: Payer: Self-pay | Admitting: Allergy and Immunology

## 2018-04-18 VITALS — BP 118/78 | HR 97 | Resp 16 | Ht 65.5 in | Wt 158.0 lb

## 2018-04-18 DIAGNOSIS — J4540 Moderate persistent asthma, uncomplicated: Secondary | ICD-10-CM | POA: Diagnosis not present

## 2018-04-18 DIAGNOSIS — J3089 Other allergic rhinitis: Secondary | ICD-10-CM

## 2018-04-18 DIAGNOSIS — K219 Gastro-esophageal reflux disease without esophagitis: Secondary | ICD-10-CM | POA: Diagnosis not present

## 2018-04-18 DIAGNOSIS — J4551 Severe persistent asthma with (acute) exacerbation: Secondary | ICD-10-CM | POA: Diagnosis not present

## 2018-04-18 MED ORDER — MONTELUKAST SODIUM 10 MG PO TABS: 10.0000 mg | ORAL_TABLET | Freq: Every day | ORAL | 1 refills | Status: DC

## 2018-04-18 MED ORDER — EPINEPHRINE 0.3 MG/0.3ML IJ SOAJ: 0.3000 mg | Freq: Once | INTRAMUSCULAR | 2 refills | Status: AC

## 2018-04-18 MED ORDER — ALBUTEROL SULFATE HFA 108 (90 BASE) MCG/ACT IN AERS: INHALATION_SPRAY | RESPIRATORY_TRACT | 1 refills | Status: DC

## 2018-04-18 MED ORDER — AZELASTINE HCL 0.1 % NA SOLN: 2.0000 | Freq: Two times a day (BID) | NASAL | 5 refills | Status: DC

## 2018-04-18 MED ORDER — BUDESONIDE-FORMOTEROL FUMARATE 160-4.5 MCG/ACT IN AERO: 2.0000 | INHALATION_SPRAY | Freq: Two times a day (BID) | RESPIRATORY_TRACT | 1 refills | Status: DC

## 2018-04-18 MED ORDER — BECLOMETHASONE DIPROP HFA 80 MCG/ACT IN AERB: 2.0000 | INHALATION_SPRAY | Freq: Two times a day (BID) | RESPIRATORY_TRACT | 1 refills | Status: DC

## 2018-04-18 MED ORDER — FLUTICASONE PROPIONATE 50 MCG/ACT NA SUSP: 2.0000 | Freq: Every day | NASAL | 5 refills | Status: DC

## 2018-04-18 NOTE — Assessment & Plan Note (Signed)
Mild exacerbation due to lack of medications.  Samples and a prescription have been provided for Symbicort 160-4.5 g, 2 inhalations twice daily.  To maximize pulmonary deposition, a spacer has been provided along with instructions for its proper administration with an HFA inhaler.  Samples have been provided for Qvar 80 g RediHaler.   For now, and during respiratory tract infections or asthma flares, add Qvar 80g 2 inhalations 2 times per day until symptoms have returned to baseline.  Continue albuterol every 4-6 hours if needed.  Restart benralizumab (Fasenra) injections.  If symptoms persist or progress over the next few days despite treatment plan as outlined above, start prednisone 40 mg x3 days, 20 mg x1 day, 10 mg x1 day, then stop.  The patient has been asked to contact me if his symptoms persist or progress. Otherwise, he may return for follow up in 4 months.

## 2018-04-18 NOTE — Assessment & Plan Note (Signed)
   Continue appropriate reflux lifestyle modifications and omeprazole as prescribed. 

## 2018-04-18 NOTE — Patient Instructions (Addendum)
Severe persistent asthma Mild exacerbation due to lack of medications.  Samples and a prescription have been provided for Symbicort 160-4.5 g, 2 inhalations twice daily.  To maximize pulmonary deposition, a spacer has been provided along with instructions for its proper administration with an HFA inhaler.  Samples have been provided for Qvar 80 g  RediHaler.   For now, and during respiratory tract infections or asthma flares, add Qvar 80g 2 inhalations 2 times per day until symptoms have returned to baseline.  Continue albuterol every 4-6 hours if needed.  Restart benralizumab (Fasenra) injections.  If symptoms persist or progress over the next few days despite treatment plan as outlined above, start prednisone 40 mg x3 days, 20 mg x1 day, 10 mg x1 day, then stop.  The patient has been asked to contact me if his symptoms persist or progress. Otherwise, he may return for follow up in 4 months.  Other allergic rhinitis  Continue appropriate allergen avoidance measures.  A prescription has been provided for azelastine nasal spray, 1-2 sprays per nostril 2 times daily as needed.   A prescription has been provided for fluticasone nasal spray, one spray per nostril 1-2 times daily as needed.  Nasal saline spray (i.e., Simply Saline) or nasal saline lavage (i.e., NeilMed) is recommended as needed and prior to medicated nasal sprays.  Gastroesophageal reflux disease  Continue appropriate reflux lifestyle modifications and omeprazole as prescribed.   Return in about 4 months (around 08/18/2018), or if symptoms worsen or fail to improve.

## 2018-04-18 NOTE — Progress Notes (Signed)
Follow-up Note  RE: Ravis Herne MRN: [id number] DOB: 10-Dec-1965 Date of Office Visit: 04/18/2018  Primary care provider: Patient, No Pcp Per Referring provider: No ref. provider found  History of present illness: Preston Taylor is a 52 y.o. male with persistent asthma, allergic rhinitis, and gastroesophageal reflux disease presenting today for a sick visit.  He was last seen in this clinic on March 6, 2019th by Dr. Ernst Bowler.  He apparently ran out of [facility] and Qvar approximately 2 weeks ago.  Over the past 2 weeks he has been experiencing frequent dyspnea, chest tightness, wheezing, and coughing.  He has been requiring albuterol rescue multiple times throughout the day and has had nocturnal awakenings over the past few nights.  He reports that he is unable to afford the [facility]. His last Fasenra injection was February 03, 2018.  He has been experiencing frequent nasal congestion because he ran out of Dymista and is unable to afford a refill.  He has no reflux related complaints today.  Assessment and plan: Severe persistent asthma Mild exacerbation due to lack of medications.  Samples and a prescription have been provided for Symbicort 160-4.5 g, 2 inhalations twice daily.  To maximize pulmonary deposition, a spacer has been provided along with instructions for its proper administration with an HFA inhaler.  Samples have been provided for Qvar 80 g  RediHaler.   For now, and during respiratory tract infections or asthma flares, add Qvar 80g 2 inhalations 2 times per day until symptoms have returned to baseline.  Continue albuterol every 4-6 hours if needed.  Restart benralizumab (Fasenra) injections.  If symptoms persist or progress over the next few days despite treatment plan as outlined above, start prednisone 40 mg x3 days, 20 mg x1 day, 10 mg x1 day, then stop.  The patient has been asked to contact me if his symptoms persist or progress. Otherwise, he may return for follow up in 4  months.  Other allergic rhinitis  Continue appropriate allergen avoidance measures.  A prescription has been provided for azelastine nasal spray, 1-2 sprays per nostril 2 times daily as needed.   A prescription has been provided for fluticasone nasal spray, one spray per nostril 1-2 times daily as needed.  Nasal saline spray (i.e., Simply Saline) or nasal saline lavage (i.e., NeilMed) is recommended as needed and prior to medicated nasal sprays.  Gastroesophageal reflux disease  Continue appropriate reflux lifestyle modifications and omeprazole as prescribed.   Meds ordered this encounter  Medications  . beclomethasone (QVAR REDIHALER) 80 MCG/ACT inhaler    Sig: Inhale 2 puffs into the lungs 2 (two) times daily.    Dispense:  10.6 g    Refill:  1  . montelukast (SINGULAIR) 10 MG tablet    Sig: Take 1 tablet (10 mg total) by mouth daily.    Dispense:  90 tablet    Refill:  1  . albuterol (PROAIR HFA) 108 (90 Base) MCG/ACT inhaler    Sig: INHALE 2 PUFFS BY MOUTH EVERY 4 TO 6 HOURS AS NEEDED FOR COUGH OR WHEEZING    Dispense:  8.5 g    Refill:  1  . EPINEPHrine (AUVI-Q) 0.3 mg/0.3 mL IJ SOAJ injection    Sig: Inject 0.3 mLs (0.3 mg total) into the muscle once for 1 dose. As directed for life-threatening allergic reactions    Dispense:  4 Device    Refill:  2  . budesonide-formoterol (SYMBICORT) 160-4.5 MCG/ACT inhaler    Sig: Inhale 2 puffs into  the lungs 2 (two) times daily.    Dispense:  30.6 g    Refill:  1  . azelastine (ASTELIN) 0.1 % nasal spray    Sig: Place 2 sprays into both nostrils 2 (two) times daily.    Dispense:  30 mL    Refill:  5  . fluticasone (FLONASE) 50 MCG/ACT nasal spray    Sig: Place 2 sprays into both nostrils daily.    Dispense:  16 g    Refill:  5    Diagnostics: Spirometry reveals an FVC of 3.65 L and an FEV1 of 3.36 L (102% predicted).  This study was performed while the patient was asymptomatic.  Please see scanned spirometry results for  details.    Physical examination: Blood pressure 118/78, pulse 97, resp. rate 16, height 5' 5.5" (1.664 m), weight 158 lb (71.7 kg), SpO2 98 %.  General: Alert, interactive, in no acute distress. HEENT: TMs pearly gray, turbinates moderately edematous with clear discharge, post-pharynx moderately erythematous. Neck: Supple without lymphadenopathy. Lungs: Clear to auscultation without wheezing, rhonchi or rales. CV: Normal S1, S2 without murmurs. Skin: Warm and dry, without lesions or rashes.  The following portions of the patient's history were reviewed and updated as appropriate: allergies, current medications, past family history, past medical history, past social history, past surgical history and problem list.  Allergies as of 04/18/2018      Reactions   Lisinopril Nausea And Vomiting      Medication List        Accurate as of 04/18/18 11:41 PM. Always use your most recent med list.          albuterol (2.5 MG/3ML) 0.083% nebulizer solution Commonly known as:  PROVENTIL Use one vial in the nebulizer every 4-6 hours if needed for cough or wheeze   albuterol 108 (90 Base) MCG/ACT inhaler Commonly known as:  PROVENTIL HFA;VENTOLIN HFA INHALE 2 PUFFS BY MOUTH EVERY 4 TO 6 HOURS AS NEEDED FOR COUGH OR WHEEZING   amLODipine 10 MG tablet Commonly known as:  NORVASC   azelastine 0.1 % nasal spray Commonly known as:  ASTELIN Place 2 sprays into both nostrils 2 (two) times daily.   beclomethasone 80 MCG/ACT inhaler Commonly known as:  QVAR Inhale 2 puffs into the lungs 2 (two) times daily.   budesonide-formoterol 160-4.5 MCG/ACT inhaler Commonly known as:  SYMBICORT Inhale 2 puffs into the lungs 2 (two) times daily.   EPINEPHrine 0.3 mg/0.3 mL Soaj injection Commonly known as:  EPI-PEN Inject 0.3 mLs (0.3 mg total) into the muscle once for 1 dose. As directed for life-threatening allergic reactions   FASENRA 30 MG/ML Sosy Generic drug:  Benralizumab INJECT 30 MG (1  SYRINGE) UNDER THE SKIN AT WEEKS 0, 4, AND 8 FOLLOWED BY EVERY 8 WEEKS THEREAFTER   fluticasone 50 MCG/ACT nasal spray Commonly known as:  FLONASE Place 2 sprays into both nostrils daily.   ipratropium-albuterol 0.5-2.5 (3) MG/3ML Soln Commonly known as:  DUONEB Use one vial in nebulizer every 4-6 hours if needed for cough or wheeze.   montelukast 10 MG tablet Commonly known as:  SINGULAIR Take 1 tablet (10 mg total) by mouth daily.   omeprazole 20 MG capsule Commonly known as:  PRILOSEC Take 1 capsule (20 mg total) by mouth 2 (two) times daily.   simvastatin 10 MG tablet Commonly known as:  ZOCOR       Allergies  Allergen Reactions  . Lisinopril Nausea And Vomiting   Review of systems: Review of  systems negative except as noted in HPI / PMHx or noted below: Constitutional: Negative.  HENT: Negative.   Eyes: Negative.  Respiratory: Negative.   Cardiovascular: Negative.  Gastrointestinal: Negative.  Genitourinary: Negative.  Musculoskeletal: Negative.  Neurological: Negative.  Endo/Heme/Allergies: Negative.  Cutaneous: Negative.  Past Medical History:  Diagnosis Date  . Allergic rhinitis   . Asthma     Family History  Problem Relation Age of Onset  . Liver cancer Brother   . Allergic rhinitis Neg Hx   . Asthma Neg Hx     Social History   Socioeconomic History  . Marital status: Single    Spouse name: Not on file  . Number of children: Not on file  . Years of education: Not on file  . Highest education level: Not on file  Occupational History  . Not on file  Social Needs  . Financial resource strain: Not on file  . Food insecurity:    Worry: Not on file    Inability: Not on file  . Transportation needs:    Medical: Not on file    Non-medical: Not on file  Tobacco Use  . Smoking status: Never Smoker  . Smokeless tobacco: Never Used  Substance and Sexual Activity  . Alcohol use: Not on file  . Drug use: Not on file  . Sexual activity: Not on  file  Lifestyle  . Physical activity:    Days per week: Not on file    Minutes per session: Not on file  . Stress: Not on file  Relationships  . Social connections:    Talks on phone: Not on file    Gets together: Not on file    Attends religious service: Not on file    Active member of club or organization: Not on file    Attends meetings of clubs or organizations: Not on file    Relationship status: Not on file  . Intimate partner violence:    Fear of current or ex partner: Not on file    Emotionally abused: Not on file    Physically abused: Not on file    Forced sexual activity: Not on file  Other Topics Concern  . Not on file  Social History Narrative  . Not on file    I appreciate the opportunity to take part in Joshuah's care. Please do not hesitate to contact me with questions.  Sincerely,   R. Edgar Frisk, MD

## 2018-04-18 NOTE — Assessment & Plan Note (Signed)
   Continue appropriate allergen avoidance measures.  A prescription has been provided for azelastine nasal spray, 1-2 sprays per nostril 2 times daily as needed.   A prescription has been provided for fluticasone nasal spray, one spray per nostril 1-2 times daily as needed.  Nasal saline spray (i.e., Simply Saline) or nasal saline lavage (i.e., NeilMed) is recommended as needed and prior to medicated nasal sprays.

## 2018-04-19 NOTE — Progress Notes (Signed)
[place] code is in [facility].

## 2018-06-03 ENCOUNTER — Other Ambulatory Visit: Payer: Self-pay | Admitting: Allergy and Immunology

## 2018-06-03 DIAGNOSIS — J4541 Moderate persistent asthma with (acute) exacerbation: Secondary | ICD-10-CM

## 2018-08-07 ENCOUNTER — Ambulatory Visit (INDEPENDENT_AMBULATORY_CARE_PROVIDER_SITE_OTHER): Payer: Commercial Managed Care - PPO | Admitting: *Deleted

## 2018-08-07 DIAGNOSIS — J4550 Severe persistent asthma, uncomplicated: Secondary | ICD-10-CM | POA: Diagnosis not present

## 2018-08-07 DIAGNOSIS — K219 Gastro-esophageal reflux disease without esophagitis: Secondary | ICD-10-CM | POA: Diagnosis not present

## 2018-08-07 DIAGNOSIS — E785 Hyperlipidemia, unspecified: Secondary | ICD-10-CM | POA: Diagnosis not present

## 2018-08-07 DIAGNOSIS — I10 Essential (primary) hypertension: Secondary | ICD-10-CM | POA: Diagnosis not present

## 2018-08-14 ENCOUNTER — Other Ambulatory Visit: Payer: Self-pay | Admitting: Allergy and Immunology

## 2018-08-21 ENCOUNTER — Other Ambulatory Visit: Payer: Self-pay

## 2018-08-21 MED ORDER — ALBUTEROL SULFATE (2.5 MG/3ML) 0.083% IN NEBU: INHALATION_SOLUTION | RESPIRATORY_TRACT | 0 refills | Status: DC

## 2018-08-21 NOTE — Telephone Encounter (Signed)
Refill sent in patient notified 

## 2018-08-21 NOTE — Telephone Encounter (Signed)
Patient is calling to see if he can get a refill on his Albuterol for his neb.  [facility]  Thanks

## 2018-08-22 ENCOUNTER — Ambulatory Visit: Payer: Commercial Managed Care - PPO | Admitting: Allergy and Immunology

## 2018-09-04 ENCOUNTER — Ambulatory Visit (INDEPENDENT_AMBULATORY_CARE_PROVIDER_SITE_OTHER): Payer: Commercial Managed Care - PPO | Admitting: Allergy

## 2018-09-04 ENCOUNTER — Encounter: Payer: Self-pay | Admitting: Allergy

## 2018-09-04 VITALS — BP 140/80 | HR 88 | Resp 20

## 2018-09-04 DIAGNOSIS — K219 Gastro-esophageal reflux disease without esophagitis: Secondary | ICD-10-CM

## 2018-09-04 DIAGNOSIS — J3089 Other allergic rhinitis: Secondary | ICD-10-CM | POA: Diagnosis not present

## 2018-09-04 DIAGNOSIS — J4550 Severe persistent asthma, uncomplicated: Secondary | ICD-10-CM | POA: Diagnosis not present

## 2018-09-04 DIAGNOSIS — J45909 Unspecified asthma, uncomplicated: Secondary | ICD-10-CM | POA: Diagnosis not present

## 2018-09-04 MED ORDER — BUDESONIDE-FORMOTEROL FUMARATE 160-4.5 MCG/ACT IN AERO: 2.0000 | INHALATION_SPRAY | Freq: Two times a day (BID) | RESPIRATORY_TRACT | 1 refills | Status: DC

## 2018-09-04 MED ORDER — MONTELUKAST SODIUM 10 MG PO TABS: 10.0000 mg | ORAL_TABLET | Freq: Every day | ORAL | 1 refills | Status: DC

## 2018-09-04 MED ORDER — TIOTROPIUM BROMIDE MONOHYDRATE 1.25 MCG/ACT IN AERS: 2.0000 | INHALATION_SPRAY | Freq: Every day | RESPIRATORY_TRACT | 3 refills | Status: DC

## 2018-09-04 MED ORDER — OMEPRAZOLE 20 MG PO CPDR: 20.0000 mg | DELAYED_RELEASE_CAPSULE | Freq: Two times a day (BID) | ORAL | 1 refills | Status: DC

## 2018-09-04 NOTE — Assessment & Plan Note (Signed)
Not well controlled despite using Symbicort 160 2 puffs BID, Qvar 80 as needed and Singulair daily. Uses nebulizer daily at night and albuterol during the day at work due to chest tightness. Missed a few doses of Fasenra last year as well.   Today's spirometry was normal but just used albuterol a few hours ago. . Daily controller medication(s): Symbicort 160 2 puffs twice a day with spacer and rinse mouth afterwards. Demonstrated proper use and sample given - patient was inhaling too quickly. Spacer given.  o START Spiriva 2 puffs once a day. Demonstrated proper use and sample given. o Continue Singulair 10mg  daily. o Continue Fasenra injections. . Prior to physical activity: May use albuterol rescue inhaler 2 puffs 5 to 15 minutes prior to strenuous physical activities. Marland Kitchen Rescue medications: May use albuterol rescue inhaler 2 puffs or nebulizer every 4 to 6 hours as needed for shortness of breath, chest tightness, coughing, and wheezing. Monitor frequency of use.

## 2018-09-04 NOTE — Patient Instructions (Addendum)
Severe persistent asthma . Daily controller medication(s): Symbicort 160 2 puffs twice a day with spacer and rinse mouth afterwards o START Spiriva 2 puffs once a day. o Continue Singulair 10mg  daily. o Continue Fasenra injections. . Prior to physical activity: May use albuterol rescue inhaler 2 puffs 5 to 15 minutes prior to strenuous physical activities. Marland Kitchen Rescue medications: May use albuterol rescue inhaler 2 puffs or nebulizer every 4 to 6 hours as needed for shortness of breath, chest tightness, coughing, and wheezing. Monitor frequency of use.  . Asthma control goals:  o Full participation in all desired activities (may need albuterol before activity) o Albuterol use two times or less a week on average (not counting use with activity) o Cough interfering with sleep two times or less a month o Oral steroids no more than once a year o No hospitalizations  Other allergic rhinitis  Continue appropriate allergen avoidance measures.  Start Nasacort 2 sprays daily.  Nasal saline spray (i.e., Simply Saline) or nasal saline lavage (i.e., NeilMed) is recommended as needed and prior to medicated nasal sprays.  Gastroesophageal reflux disease  Continue appropriate reflux lifestyle modifications and omeprazole as prescribed.  Follow up in 3 months

## 2018-09-04 NOTE — Progress Notes (Signed)
Follow Up Note  RE: Preston Taylor MRN: [id number] DOB: [birth year] Date of Office Visit: 09/04/2018  Referring provider: No ref. provider found Primary care provider: Patient, No Pcp Per  Chief Complaint: Asthma  History of Present Illness: I had the pleasure of seeing Preston Taylor for a follow up visit at the [facility] [place] [facility] [place] on 09/04/2018. He is a 53 y.o. male, who is being followed for asthma, allergic rhinitis, GERD. Today he is here for regular follow up visit. His previous allergy office visit was on 04/18/2018 with Dr. Verlin Fester.   Severe persistent asthma Patient ran out of Qvar about 4 weeks ago.   Currently taking Symbicort 160 2 puffs bid, Singulair daily and was using Qvar about 3-4 times a week with good benefit. Still using nebulizer every night due to chest tightness. Patient uses albuterol a few times a day at work due to chest tightness as well. He works in a [employer].   Currently on Fasenra every 8 months with good benefit. No oral prednisone since the last visit.  ACT score 13  Other allergic rhinitis Currently on Flonase 1 spray BID but causing nosebleeds. Taking fexofenadine 180mg  1-2 times a day with some benefit.   Gastroesophageal reflux disease Taking omeprazole as prescribed. Noticed worsening symptoms if misses a few days.  Assessment and Plan: Preston Taylor is a 53 y.o. male with: Severe persistent asthma Not well controlled despite using Symbicort 160 2 puffs BID, Qvar 80 as needed and Singulair daily. Uses nebulizer daily at night and albuterol during the day at work due to chest tightness. Missed a few doses of Fasenra last year as well.   Today's spirometry was normal but just used albuterol a few hours ago. . Daily controller medication(s): Symbicort 160 2 puffs twice a day with spacer and rinse mouth afterwards. Demonstrated proper use and sample given - patient was inhaling too quickly. Spacer given.  o START Spiriva 2 puffs once a day. Demonstrated proper  use and sample given. o Continue Singulair 10mg  daily. o Continue Fasenra injections. . Prior to physical activity: May use albuterol rescue inhaler 2 puffs 5 to 15 minutes prior to strenuous physical activities. Marland Kitchen Rescue medications: May use albuterol rescue inhaler 2 puffs or nebulizer every 4 to 6 hours as needed for shortness of breath, chest tightness, coughing, and wheezing. Monitor frequency of use.   Other allergic rhinitis Past history - 2014 spt positive to molds.  Interim history - having epistaxis and nasal congestion.   Continue appropriate allergen avoidance measures.  Start Nasacort 2 sprays daily. Sample given and demonstrated proper use.  Nasal saline spray (i.e., Simply Saline) or nasal saline lavage (i.e., NeilMed) is recommended as needed and prior to medicated nasal sprays.  Continue Singulair 10mg  daily.  Continue allegra 180mg  daily.  Gastroesophageal reflux disease Controlled on PPI.  Continue appropriate reflux lifestyle modifications and omeprazole as prescribed.  Return in about 3 months (around 12/04/2018).  Meds ordered this encounter  Medications  . budesonide-formoterol (SYMBICORT) 160-4.5 MCG/ACT inhaler    Sig: Inhale 2 puffs into the lungs 2 (two) times daily.    Dispense:  30.6 g    Refill:  1  . Tiotropium Bromide Monohydrate (SPIRIVA RESPIMAT) 1.25 MCG/ACT AERS    Sig: Inhale 2 puffs into the lungs daily.    Dispense:  4 g    Refill:  3  . montelukast (SINGULAIR) 10 MG tablet    Sig: Take 1 tablet (10 mg total) by mouth  daily.    Dispense:  90 tablet    Refill:  1  . omeprazole (PRILOSEC) 20 MG capsule    Sig: Take 1 capsule (20 mg total) by mouth 2 (two) times daily.    Dispense:  180 capsule    Refill:  1   Diagnostics: Spirometry:  Tracings reviewed. His effort: Good reproducible efforts. FVC: 3.71L FEV1: 3.35L, 119% predicted FEV1/FVC ratio: 90% Interpretation: Spirometry consistent with normal pattern. Just used albuterol a  few hours ago. Please see scanned spirometry results for details.  Medication List:  Current Outpatient Medications  Medication Sig Dispense Refill  . albuterol (PROAIR HFA) 108 (90 Base) MCG/ACT inhaler INHALE 2 PUFFS BY MOUTH EVERY 4 TO 6 HOURS AS NEEDED FOR COUGH OR WHEEZING 8.5 g 1  . albuterol (PROVENTIL) (2.5 MG/3ML) 0.083% nebulizer solution Use one vial in the nebulizer every 4-6 hours if needed for cough or wheeze 300 mL 0  . amLODipine (NORVASC) 10 MG tablet     . azelastine (ASTELIN) 0.1 % nasal spray Place 2 sprays into both nostrils 2 (two) times daily. 30 mL 5  . beclomethasone (QVAR REDIHALER) 80 MCG/ACT inhaler Inhale 2 puffs into the lungs 2 (two) times daily. 10.6 g 1  . budesonide-formoterol (SYMBICORT) 160-4.5 MCG/ACT inhaler Inhale 2 puffs into the lungs 2 (two) times daily. 30.6 g 1  . FASENRA 30 MG/ML SOSY INJECT 30 MG (1 SYRINGE) UNDER THE SKIN AT WEEKS 0, 4, AND 8 FOLLOWED BY EVERY 8 WEEKS THEREAFTER 1 mL 8  . fluticasone (FLONASE) 50 MCG/ACT nasal spray Place 2 sprays into both nostrils daily. 16 g 5  . montelukast (SINGULAIR) 10 MG tablet Take 1 tablet (10 mg total) by mouth daily. 90 tablet 1  . omeprazole (PRILOSEC) 20 MG capsule Take 1 capsule (20 mg total) by mouth 2 (two) times daily. 180 capsule 1  . simvastatin (ZOCOR) 10 MG tablet     . ipratropium-albuterol (DUONEB) 0.5-2.5 (3) MG/3ML SOLN USE 1 VIAL IN NEBULIZER EVERY 4 TO 6 HOURS IF NEEDED FOR COUGH OR WHEEZE (Patient not taking: Reported on 09/04/2018) 300 mL 1  . losartan (COZAAR) 25 MG tablet     . Tiotropium Bromide Monohydrate (SPIRIVA RESPIMAT) 1.25 MCG/ACT AERS Inhale 2 puffs into the lungs daily. 4 g 3   Current Facility-Administered Medications  Medication Dose Route Frequency Provider Last Rate Last Dose  . Benralizumab SOSY 30 mg  30 mg Subcutaneous Q8 Weeks Jiles Prows, MD   30 mg at 08/07/18 1435   Allergies: Allergies  Allergen Reactions  . Lisinopril Nausea And Vomiting   I reviewed  his past medical history, social history, family history, and environmental history and no significant changes have been reported from previous visit on 04/18/2018.  Review of Systems  Constitutional: Negative for appetite change, chills, fever and unexpected weight change.  HENT: Positive for congestion. Negative for rhinorrhea.   Eyes: Negative for itching.  Respiratory: Positive for chest tightness. Negative for cough, shortness of breath and wheezing.   Gastrointestinal: Negative for abdominal pain.  Skin: Negative for rash.  Allergic/Immunologic: Positive for environmental allergies.  Neurological: Negative for headaches.   Objective: BP 140/80   Pulse 88   Resp 20  There is no height or weight on file to calculate BMI. Physical Exam  Constitutional: He is oriented to person, place, and time. He appears well-developed and well-nourished.  HENT:  Head: Normocephalic and atraumatic.  Right Ear: External ear normal.  Left Ear: External ear normal.  Nose: Nose normal.  Mouth/Throat: Oropharynx is clear and moist.  Eyes: Conjunctivae and EOM are normal.  Neck: Neck supple.  Cardiovascular: Normal rate, regular rhythm and normal heart sounds. Exam reveals no gallop and no friction rub.  No murmur heard. Pulmonary/Chest: Effort normal and breath sounds normal. He has no wheezes. He has no rales.  Lymphadenopathy:    He has no cervical adenopathy.  Neurological: He is alert and oriented to person, place, and time.  Skin: Skin is warm. No rash noted.  Psychiatric: He has a normal mood and affect. His behavior is normal.  Nursing note and vitals reviewed.  Previous notes and tests were reviewed. The plan was reviewed with the patient/family, and all questions/concerned were addressed.  It was my pleasure to see Advit today and participate in his care. Please feel free to contact me with any questions or concerns.  Sincerely,  Rexene Alberts, DO Allergy & Immunology  [facility] office: [phone] [facility] office: [phone]

## 2018-09-04 NOTE — Assessment & Plan Note (Signed)
Controlled on PPI.  Continue appropriate reflux lifestyle modifications and omeprazole as prescribed. 

## 2018-09-04 NOTE — Assessment & Plan Note (Signed)
Past history - 2014 spt positive to molds.  Interim history - having epistaxis and nasal congestion.   Continue appropriate allergen avoidance measures.  Start Nasacort 2 sprays daily. Sample given and demonstrated proper use.  Nasal saline spray (i.e., Simply Saline) or nasal saline lavage (i.e., NeilMed) is recommended as needed and prior to medicated nasal sprays.  Continue Singulair 10mg  daily.  Continue allegra 180mg  daily.

## 2018-09-05 ENCOUNTER — Telehealth: Payer: Self-pay | Admitting: *Deleted

## 2018-09-05 NOTE — Telephone Encounter (Signed)
Received fax from pharmacy that Spiriva is not on the patient's formulary and will not be covered. Please advise an alternative.

## 2018-09-05 NOTE — Telephone Encounter (Signed)
PA has been attempted and denied. The medication is strictly excluded from the patient's formulary.

## 2018-09-05 NOTE — Addendum Note (Signed)
Addended by: Lucrezia Starch I on: 09/05/2018 07:10 AM   Modules accepted: Orders

## 2018-09-06 ENCOUNTER — Other Ambulatory Visit: Payer: Self-pay | Admitting: *Deleted

## 2018-09-06 NOTE — Telephone Encounter (Signed)
The only LAMA inhaler that is approved for asthma is the spiriva respimat 1.49mcg. All the other LAMA inhalers (Incruse, spiriva handihaler, Caprice Renshaw, seebria)  are for COPD which this patient does not have.   Is there anyway to include this in the PA?   Also, call the patient and see if he noticed any benefit from the new inhaler but it may be too early to tell.  Thank you.

## 2018-09-06 NOTE — Telephone Encounter (Signed)
Attempted to call patient and was unable to get to voicemail. Will attempt to call back. Called [facility] at [phone] and spoke with a PA representative and they stated that he has to try and fail Incruse Ellipta first then we can do a verbal PA for coverage approval of Spiriva. Please advise. Checked patient's medication history and did not see a history of Incruse Ellipta.

## 2018-09-06 NOTE — Telephone Encounter (Signed)
Please send in incruse elipta 1 puff once a day. Let patient know about medication change.  Thank you.

## 2018-09-06 NOTE — Telephone Encounter (Signed)
Called patient and he stated that the pharmacy said that Spiriva would be $35.00 with a coupon. Patient stated that he is fine with paying $35.00. Advised that if he has any issues to please let us know.

## 2018-09-12 DIAGNOSIS — J111 Influenza due to unidentified influenza virus with other respiratory manifestations: Secondary | ICD-10-CM | POA: Diagnosis not present

## 2018-09-18 ENCOUNTER — Other Ambulatory Visit: Payer: Self-pay | Admitting: Allergy

## 2018-09-18 MED ORDER — ALBUTEROL SULFATE HFA 108 (90 BASE) MCG/ACT IN AERS: INHALATION_SPRAY | RESPIRATORY_TRACT | 1 refills | Status: DC

## 2018-10-02 ENCOUNTER — Ambulatory Visit (INDEPENDENT_AMBULATORY_CARE_PROVIDER_SITE_OTHER): Payer: Commercial Managed Care - PPO

## 2018-10-02 DIAGNOSIS — J4550 Severe persistent asthma, uncomplicated: Secondary | ICD-10-CM

## 2018-10-20 ENCOUNTER — Other Ambulatory Visit: Payer: Self-pay | Admitting: Allergy and Immunology

## 2018-11-01 ENCOUNTER — Other Ambulatory Visit: Payer: Self-pay | Admitting: Allergy and Immunology

## 2018-11-27 ENCOUNTER — Ambulatory Visit (INDEPENDENT_AMBULATORY_CARE_PROVIDER_SITE_OTHER): Payer: Commercial Managed Care - PPO

## 2018-11-27 DIAGNOSIS — J4550 Severe persistent asthma, uncomplicated: Secondary | ICD-10-CM | POA: Diagnosis not present

## 2018-11-30 ENCOUNTER — Encounter: Payer: Self-pay | Admitting: Allergy

## 2018-11-30 ENCOUNTER — Ambulatory Visit (INDEPENDENT_AMBULATORY_CARE_PROVIDER_SITE_OTHER): Payer: Commercial Managed Care - PPO | Admitting: Allergy

## 2018-11-30 ENCOUNTER — Other Ambulatory Visit: Payer: Self-pay

## 2018-11-30 DIAGNOSIS — K219 Gastro-esophageal reflux disease without esophagitis: Secondary | ICD-10-CM | POA: Diagnosis not present

## 2018-11-30 DIAGNOSIS — J3089 Other allergic rhinitis: Secondary | ICD-10-CM

## 2018-11-30 DIAGNOSIS — J4550 Severe persistent asthma, uncomplicated: Secondary | ICD-10-CM | POA: Diagnosis not present

## 2018-11-30 MED ORDER — LEVOCETIRIZINE DIHYDROCHLORIDE 5 MG PO TABS: 5.0000 mg | ORAL_TABLET | Freq: Two times a day (BID) | ORAL | 5 refills | Status: DC | PRN

## 2018-11-30 MED ORDER — AZELASTINE HCL 0.1 % NA SOLN: NASAL | 5 refills | Status: DC

## 2018-11-30 MED ORDER — PREDNISONE 10 MG PO TABS: ORAL_TABLET | ORAL | 0 refills | Status: DC

## 2018-11-30 NOTE — Patient Instructions (Addendum)
Severe persistent asthma  Daily controller medication(s):Symbicort 160 2 puffs twice a day with spacer and rinse mouth afterwards.  ? Continue Spiriva 2 puffs once a day.  ? Continue Singulair 10mg  daily. ? Continue Fasenra injections.  Prior to physical activity:May use albuterol rescue inhaler 2 puffs 5 to 15 minutes prior to strenuous physical activities.  Rescue medications:May use albuterol rescue inhaler 2 puffs or nebulizer every 4 to 6 hours as needed for shortness of breath, chest tightness, coughing, and wheezing. Monitor frequency of use.   Other allergic rhinitis  May use over the counter antihistamines such as Zyrtec (cetirizine), Claritin (loratadine), Allegra (fexofenadine), or Xyzal (levocetirizine) daily as needed. May take twice a day if needed.   Continue appropriate allergen avoidance measures.  Continue Fluticasone 1 spray twice a day.  Start azelastine 1-2 sprays twice a day as needed for runny nose.   Nasal saline spray (i.e., Simply Saline) or nasal saline lavage (i.e., NeilMed) is recommended as needed and prior to medicated nasal sprays.  Continue Singulair 10mg  daily.  Continue allegra 180mg  daily.  Gastroesophageal reflux disease  Continue appropriate reflux lifestyle modifications and omeprazole as prescribed.  Follow up in 2 months Sincerely,  Rexene Alberts, DO Allergy & Immunology  [facility] [place] [facility] office: [phone] [facility] office: [phone]

## 2018-11-30 NOTE — Assessment & Plan Note (Signed)
Controlled on PPI.  Continue appropriate reflux lifestyle modifications and omeprazole as prescribed.

## 2018-11-30 NOTE — Progress Notes (Signed)
RE: Preston Taylor MRN: [id number] DOB: 07-02-1966 Date of Telemedicine Visit: 11/30/2018  Referring provider: No ref. provider found Primary care provider: Patient, No Pcp Per  Chief Complaint: Asthma (Televisit at home. Patient gave verbal consent to treat and bill insurance for visit.) and Allergic Rhinitis    Telemedicine Follow Up Visit via Telephone: I connected with Preston Taylor for a follow up on 11/30/18 by telephone and verified that I am speaking with the correct person using two identifiers.   I discussed the limitations, risks, security and privacy concerns of performing an evaluation and management service by telephone and the availability of in person appointments. I also discussed with the patient that there may be a patient responsible charge related to this service. The patient expressed understanding and agreed to proceed.  Patient is at home. Provider is at the office.  Visit start time: 10:27AM Visit end time: 10:42AM Insurance consent/check in by: Vance Gather Medical consent and medical assistant/nurse: [place] Senate  History of Present Illness: He is a 53 y.o. male, who is being followed for asthma, allergic rhinitis, GERD. His previous allergy office visit was on 09/04/2018 with Dr. Maudie Mercury. Today is a regular follow up visit.  Severe persistent asthma ACT score 15 today.   Currently on Symbicort 160 2 puffs BID, Spiriva 2 puffs once a day, Fasenra injections. Using albuterol 1-2 times day with the increased pollen in the air for the past 1 month with some benefit.  Patient had the flu about 2 months ago and was treated with tamiflu but not feeling quite back to baseline. No prednisone since the last visit.   Other allergic rhinitis Increased rhinorrhea, sneezing for the past 1 month Currently on fexofenadine 180mg  once a day with no benefit. Also using Flonase 1 spray twice a day with some benefit. The Nasacort did not help as much.  Still taking montelukast tablet.    Gastroesophageal reflux disease Controlled on PPI.  Assessment and Plan: Preston Taylor is a 53 y.o. male with: Not well controlled severe persistent asthma Not well controlled the last 1 month with increased pollen in the air. ACT score 15.   Start prednisone taper.   Daily controller medication(s):Symbicort 160 2 puffs twice a day with spacer and rinse mouth afterwards.  ? Continue Spiriva 2 puffs once a day.  ? Continue Singulair 10mg  daily. ? Continue Fasenra injections.  Prior to physical activity:May use albuterol rescue inhaler 2 puffs 5 to 15 minutes prior to strenuous physical activities.  Rescue medications:May use albuterol rescue inhaler 2 puffs or nebulizer every 4 to 6 hours as needed for shortness of breath, chest tightness, coughing, and wheezing. Monitor frequency of use.   Get spirometry at next visit.  Other allergic rhinitis Past history - 2014 spt positive to molds.  Interim history - Increased sneezing and rhinorrhea for the past 1 month.   May use over the counter antihistamines such as Zyrtec (cetirizine), Claritin (loratadine), Allegra (fexofenadine), or Xyzal (levocetirizine) daily as needed. May take twice a day if needed.   Continue appropriate allergen avoidance measures.  Continue Fluticasone 1 spray twice a day.  Start azelastine 1-2 sprays twice a day as needed for runny nose.   Nasal saline spray (i.e., Simply Saline) or nasal saline lavage (i.e., NeilMed) is recommended as needed and prior to medicated nasal sprays.  Continue Singulair 10mg  daily.  Continue allegra 180mg  daily.  Consider repeat testing in future.   Gastroesophageal reflux disease Controlled on PPI.  Continue appropriate reflux lifestyle  modifications and omeprazole as prescribed.  Return in about 2 months (around 01/30/2019).  Meds ordered this encounter  Medications  . azelastine (ASTELIN) 0.1 % nasal spray    Sig: Take 1-2 sprays up to twice a day as needed for runny  nose.    Dispense:  30 mL    Refill:  5  . levocetirizine (XYZAL) 5 MG tablet    Sig: Take 1 tablet (5 mg total) by mouth 2 (two) times daily as needed for allergies.    Dispense:  60 tablet    Refill:  5  . predniSONE (DELTASONE) 10 MG tablet    Sig: Take prednisone 20mg  twice a day for 3 days, then 20mg  for 1 day then 10mg  for 1 day.    Dispense:  15 tablet    Refill:  0   Diagnostics: None.  Medication List:  Current Outpatient Medications  Medication Sig Dispense Refill  . albuterol (PROAIR HFA) 108 (90 Base) MCG/ACT inhaler INHALE 2 PUFFS BY MOUTH EVERY 4 TO 6 HOURS AS NEEDED FOR COUGH OR WHEEZING 3 Inhaler 1  . albuterol (PROVENTIL) (2.5 MG/3ML) 0.083% nebulizer solution Use one vial in the nebulizer every 4-6 hours if needed for cough or wheeze 300 mL 0  . amLODipine (NORVASC) 10 MG tablet     . azelastine (ASTELIN) 0.1 % nasal spray Take 1-2 sprays up to twice a day as needed for runny nose. 30 mL 5  . budesonide-formoterol (SYMBICORT) 160-4.5 MCG/ACT inhaler Inhale 2 puffs into the lungs 2 (two) times daily. 30.6 g 1  . FASENRA 30 MG/ML SOSY INJECT 30 MG (1 SYRINGE) UNDER THE SKIN EVERY 8 WEEKS 1 mL 5  . fluticasone (FLONASE) 50 MCG/ACT nasal spray Place 2 sprays into both nostrils daily. 16 g 5  . ipratropium-albuterol (DUONEB) 0.5-2.5 (3) MG/3ML SOLN USE 1 VIAL IN NEBULIZER EVERY 4 TO 6 HOURS IF NEEDED FOR COUGH OR WHEEZE 300 mL 10  . losartan (COZAAR) 25 MG tablet     . montelukast (SINGULAIR) 10 MG tablet Take 1 tablet (10 mg total) by mouth daily. 90 tablet 1  . omeprazole (PRILOSEC) 20 MG capsule Take 1 capsule (20 mg total) by mouth 2 (two) times daily. 180 capsule 1  . simvastatin (ZOCOR) 10 MG tablet     . Tiotropium Bromide Monohydrate (SPIRIVA RESPIMAT) 1.25 MCG/ACT AERS Inhale 2 puffs into the lungs daily. 4 g 3  . levocetirizine (XYZAL) 5 MG tablet Take 1 tablet (5 mg total) by mouth 2 (two) times daily as needed for allergies. 60 tablet 5  . predniSONE  (DELTASONE) 10 MG tablet Take prednisone 20mg  twice a day for 3 days, then 20mg  for 1 day then 10mg  for 1 day. 15 tablet 0   Current Facility-Administered Medications  Medication Dose Route Frequency Provider Last Rate Last Dose  . Benralizumab SOSY 30 mg  30 mg Subcutaneous Q8 Weeks Jiles Prows, MD   30 mg at 11/27/18 1110   Allergies: Allergies  Allergen Reactions  . Lisinopril Nausea And Vomiting   I reviewed his past medical history, social history, family history, and environmental history and no significant changes have been reported from previous visit on 09/04/2018.  Review of Systems  Constitutional: Negative for appetite change, chills, fever and unexpected weight change.  HENT: Positive for congestion, rhinorrhea and sneezing.   Eyes: Negative for itching.  Respiratory: Positive for chest tightness. Negative for cough, shortness of breath and wheezing.   Gastrointestinal: Negative for abdominal pain.  Skin: Negative for rash.  Allergic/Immunologic: Positive for environmental allergies.  Neurological: Negative for headaches.   Objective: Physical Exam  No audible distress. Patient not coughing or wheezing and speaking in full distances.  Not obtained as encounter was done via telephone.   Previous notes and tests were reviewed.  I discussed the assessment and treatment plan with the patient. The patient was provided an opportunity to ask questions and all were answered. The patient agreed with the plan and demonstrated an understanding of the instructions. After visit summary/patient instructions available via e-mail.   The patient was advised to call back or seek an in-person evaluation if the symptoms worsen or if the condition fails to improve as anticipated.  I provided 15 minutes of non-face-to-face time during this encounter.  It was my pleasure to participate in [place] care today. Please feel free to contact me with any questions or concerns.   Sincerely,   Rexene Alberts, DO Allergy & Immunology  [facility] office: [phone] [facility] office: [phone]

## 2018-11-30 NOTE — Assessment & Plan Note (Signed)
Past history - 2014 spt positive to molds.  Interim history - Increased sneezing and rhinorrhea for the past 1 month.   May use over the counter antihistamines such as Zyrtec (cetirizine), Claritin (loratadine), Allegra (fexofenadine), or Xyzal (levocetirizine) daily as needed. May take twice a day if needed.   Continue appropriate allergen avoidance measures.  Continue Fluticasone 1 spray twice a day.  Start azelastine 1-2 sprays twice a day as needed for runny nose.   Nasal saline spray (i.e., Simply Saline) or nasal saline lavage (i.e., NeilMed) is recommended as needed and prior to medicated nasal sprays.  Continue Singulair 10mg  daily.  Continue allegra 180mg  daily.  Consider repeat testing in future.

## 2018-11-30 NOTE — Assessment & Plan Note (Signed)
Not well controlled the last 1 month with increased pollen in the air. ACT score 15.   Start prednisone taper.   Daily controller medication(s):Symbicort 160 2 puffs twice a day with spacer and rinse mouth afterwards.  ? Continue Spiriva 2 puffs once a day.  ? Continue Singulair 10mg  daily. ? Continue Fasenra injections.  Prior to physical activity:May use albuterol rescue inhaler 2 puffs 5 to 15 minutes prior to strenuous physical activities.  Rescue medications:May use albuterol rescue inhaler 2 puffs or nebulizer every 4 to 6 hours as needed for shortness of breath, chest tightness, coughing, and wheezing. Monitor frequency of use.   Get spirometry at next visit.

## 2018-12-03 ENCOUNTER — Other Ambulatory Visit: Payer: Self-pay | Admitting: Allergy and Immunology

## 2018-12-04 ENCOUNTER — Ambulatory Visit: Payer: Commercial Managed Care - PPO | Admitting: Allergy

## 2019-01-01 ENCOUNTER — Other Ambulatory Visit: Payer: Self-pay | Admitting: Allergy

## 2019-01-22 ENCOUNTER — Other Ambulatory Visit: Payer: Self-pay

## 2019-01-22 ENCOUNTER — Ambulatory Visit (INDEPENDENT_AMBULATORY_CARE_PROVIDER_SITE_OTHER): Payer: Commercial Managed Care - PPO

## 2019-01-22 DIAGNOSIS — J4550 Severe persistent asthma, uncomplicated: Secondary | ICD-10-CM

## 2019-01-31 ENCOUNTER — Ambulatory Visit: Payer: Commercial Managed Care - PPO | Admitting: Allergy

## 2019-01-31 ENCOUNTER — Other Ambulatory Visit: Payer: Self-pay

## 2019-01-31 ENCOUNTER — Encounter: Payer: Self-pay | Admitting: Allergy

## 2019-01-31 ENCOUNTER — Ambulatory Visit (INDEPENDENT_AMBULATORY_CARE_PROVIDER_SITE_OTHER): Payer: Commercial Managed Care - PPO | Admitting: Allergy

## 2019-01-31 DIAGNOSIS — K219 Gastro-esophageal reflux disease without esophagitis: Secondary | ICD-10-CM | POA: Diagnosis not present

## 2019-01-31 DIAGNOSIS — J3089 Other allergic rhinitis: Secondary | ICD-10-CM | POA: Diagnosis not present

## 2019-01-31 DIAGNOSIS — J4550 Severe persistent asthma, uncomplicated: Secondary | ICD-10-CM | POA: Diagnosis not present

## 2019-01-31 MED ORDER — BUDESONIDE-FORMOTEROL FUMARATE 160-4.5 MCG/ACT IN AERO: INHALATION_SPRAY | RESPIRATORY_TRACT | 2 refills | Status: DC

## 2019-01-31 MED ORDER — SPIRIVA RESPIMAT 1.25 MCG/ACT IN AERS: 2.0000 | INHALATION_SPRAY | Freq: Every day | RESPIRATORY_TRACT | 2 refills | Status: DC

## 2019-01-31 NOTE — Assessment & Plan Note (Deleted)
Doing well. Did not need any additional prednisone. Using albuterol 2-3 times a week due to chest tightness.   Daily controller medication(s):Symbicort 160 2 puffs twice a day with spacer and rinse mouth afterwards.  ? Continue Spiriva 2 puffs once a day. ? Continue Singulair 10mg  daily. ? Continue Fasenra injections every 8 weeks.   Prior to physical activity:May use albuterol rescue inhaler 2 puffs 5 to 15 minutes prior to strenuous physical activities.  Rescue medications:May use albuterol rescue inhaler 2 puffs or nebulizer every 4 to 6 hours as needed for shortness of breath, chest tightness, coughing, and wheezing. Monitor frequency of use.  Get spirometry at next visit.

## 2019-01-31 NOTE — Assessment & Plan Note (Signed)
Controlled on PPI.  Continue appropriate reflux lifestyle modifications and omeprazole as prescribed.

## 2019-01-31 NOTE — Assessment & Plan Note (Signed)
Doing well. Did not need any additional prednisone. Using albuterol 2-3 times a week due to chest tightness.   Daily controller medication(s):Symbicort 160 2 puffs twice a day with spacer and rinse mouth afterwards.  ? Continue Spiriva 2 puffs once a day. ? Continue Singulair 10mg  daily. ? Continue Fasenra injections every 8 weeks.   Prior to physical activity:May use albuterol rescue inhaler 2 puffs 5 to 15 minutes prior to strenuous physical activities.  Rescue medications:May use albuterol rescue inhaler 2 puffs or nebulizer every 4 to 6 hours as needed for shortness of breath, chest tightness, coughing, and wheezing. Monitor frequency of use.  Get spirometry at next visit.

## 2019-01-31 NOTE — Progress Notes (Signed)
RE: Preston Taylor MRN: [id number] DOB: 1966-01-09 Date of Telemedicine Visit: 01/31/2019  Referring provider: No ref. provider found Primary care provider: Patient, No Pcp Per  Chief Complaint: Asthma (pt states breathing is doing good sine allergy med change)   Telemedicine Follow Up Visit via Telephone: I connected with Preston Taylor for a follow up on 01/31/19 by telephone and verified that I am speaking with the correct person using two identifiers.   I discussed the limitations, risks, security and privacy concerns of performing an evaluation and management service by telephone and the availability of in person appointments. I also discussed with the patient that there may be a patient responsible charge related to this service. The patient expressed understanding and agreed to proceed.  Patient is at work. Provider is at the office.  Visit start time: 3:36PM Visit end time: 3:52PM Insurance consent/check in by: front desk Medical consent and medical assistant/nurse: Carlis Abbott.  History of Present Illness: He is a 53 y.o. male, who is being followed for asthma, allergic rhinitis, GERD. His previous allergy office visit was on 11/30/2018 with Dr. Maudie Mercury. Today is a regular follow up visit.  Asthma: Currently on Symbicort 160 2 puffs BID, Spiriva 2 puffs daily, Singulair daily, Fasenra every 8 weeks. Using albuterol twice a week and albuterol nebulizer once a week due to chest tightness with good benefit. These episodes usually occur after eating. He does not think it's his reflux. He is taking PPI on a daily basis.   Denies any ER/urgent care visits or prednisone use since the last visit.  Other allergic rhinitis Doing better with azelastine nasal spray and Flonase. Minimal rhinitis symptoms. Still taking Singulair and allegra daily.   Gastroesophageal reflux disease Controlled on PPI.   Assessment and Plan: [place] is a 53 y.o. male with: Other allergic rhinitis Past history - 2014 spt  positive to molds.  Interim history - doing much better with azelastine nasal spray.  May use over the counter antihistamines such as Zyrtec (cetirizine), Claritin (loratadine), Allegra (fexofenadine), or Xyzal (levocetirizine) daily as needed. May take twice a day if needed.   Continue appropriate allergen avoidance measures.  Continue Fluticasone 1 spray twice a day.  Continue azelastine 1-2 sprays twice a day as needed for runny nose.   Nasal saline spray (i.e., Simply Saline) or nasal saline lavage (i.e., NeilMed) is recommended as needed and prior to medicated nasal sprays.  Continue Singulair 10mg  daily.  Consider repeat testing in future.   Gastroesophageal reflux disease Controlled on PPI.  Continue appropriate reflux lifestyle modifications and omeprazole as prescribed.  Severe persistent asthma without complication Doing well. Did not need any additional prednisone. Using albuterol 2-3 times a week due to chest tightness.   Daily controller medication(s):Symbicort 160 2 puffs twice a day with spacer and rinse mouth afterwards.  ? Continue Spiriva 2 puffs once a day. ? Continue Singulair 10mg  daily. ? Continue Fasenra injections every 8 weeks.   Prior to physical activity:May use albuterol rescue inhaler 2 puffs 5 to 15 minutes prior to strenuous physical activities.  Rescue medications:May use albuterol rescue inhaler 2 puffs or nebulizer every 4 to 6 hours as needed for shortness of breath, chest tightness, coughing, and wheezing. Monitor frequency of use.  Get spirometry at next visit.   Return in about 3 months (around 05/03/2019).  Meds ordered this encounter  Medications  . Tiotropium Bromide Monohydrate (SPIRIVA RESPIMAT) 1.25 MCG/ACT AERS    Sig: Inhale 2 puffs into the lungs daily.  Dispense:  3 Inhaler    Refill:  2  . budesonide-formoterol (SYMBICORT) 160-4.5 MCG/ACT inhaler    Sig: USE 2 INHALATIONS TWICE A DAY    Dispense:  3 Inhaler     Refill:  2   Diagnostics: None.  Medication List:  Current Outpatient Medications  Medication Sig Dispense Refill  . albuterol (PROAIR HFA) 108 (90 Base) MCG/ACT inhaler INHALE 2 PUFFS BY MOUTH EVERY 4 TO 6 HOURS AS NEEDED FOR COUGH OR WHEEZING 3 Inhaler 1  . albuterol (PROVENTIL) (2.5 MG/3ML) 0.083% nebulizer solution Use one vial in the nebulizer every 4-6 hours if needed for cough or wheeze 300 mL 0  . amLODipine (NORVASC) 10 MG tablet     . azelastine (ASTELIN) 0.1 % nasal spray Take 1-2 sprays up to twice a day as needed for runny nose. 30 mL 5  . budesonide-formoterol (SYMBICORT) 160-4.5 MCG/ACT inhaler USE 2 INHALATIONS TWICE A DAY 3 Inhaler 2  . FASENRA 30 MG/ML SOSY INJECT 30 MG (1 SYRINGE) UNDER THE SKIN EVERY 8 WEEKS 1 mL 5  . fluticasone (FLONASE) 50 MCG/ACT nasal spray Place 2 sprays into both nostrils daily. 16 g 5  . ipratropium-albuterol (DUONEB) 0.5-2.5 (3) MG/3ML SOLN USE 1 VIAL IN NEBULIZER EVERY 4 TO 6 HOURS IF NEEDED FOR COUGH OR WHEEZE 300 mL 10  . levocetirizine (XYZAL) 5 MG tablet Take 1 tablet (5 mg total) by mouth 2 (two) times daily as needed for allergies. 60 tablet 5  . losartan (COZAAR) 25 MG tablet     . montelukast (SINGULAIR) 10 MG tablet Take 1 tablet (10 mg total) by mouth daily. 90 tablet 1  . omeprazole (PRILOSEC) 20 MG capsule Take 1 capsule (20 mg total) by mouth 2 (two) times daily. 180 capsule 1  . simvastatin (ZOCOR) 10 MG tablet     . Tiotropium Bromide Monohydrate (SPIRIVA RESPIMAT) 1.25 MCG/ACT AERS Inhale 2 puffs into the lungs daily. 3 Inhaler 2  . predniSONE (DELTASONE) 10 MG tablet Take prednisone 20mg  twice a day for 3 days, then 20mg  for 1 day then 10mg  for 1 day. (Patient not taking: Reported on 01/31/2019) 15 tablet 0   Current Facility-Administered Medications  Medication Dose Route Frequency Provider Last Rate Last Dose  . Benralizumab SOSY 30 mg  30 mg Subcutaneous Q8 Weeks Jiles Prows, MD   30 mg at 01/22/19 1411   Allergies:  Allergies  Allergen Reactions  . Lisinopril Nausea And Vomiting   I reviewed his past medical history, social history, family history, and environmental history and no significant changes have been reported from previous visit on 11/30/2018.  Review of Systems  Constitutional: Negative for appetite change, chills, fever and unexpected weight change.  HENT: Negative for congestion, rhinorrhea and sneezing.   Eyes: Negative for itching.  Respiratory: Positive for chest tightness. Negative for cough, shortness of breath and wheezing.   Gastrointestinal: Negative for abdominal pain.  Skin: Negative for rash.  Allergic/Immunologic: Positive for environmental allergies.  Neurological: Negative for headaches.   Objective: Physical Exam Not obtained as encounter was done via telephone.  Speaking in full sentences with no distress.  Previous notes and tests were reviewed.  I discussed the assessment and treatment plan with the patient. The patient was provided an opportunity to ask questions and all were answered. The patient agreed with the plan and demonstrated an understanding of the instructions. After visit summary/patient instructions available via e-mail.   The patient was advised to call back or seek an  in-person evaluation if the symptoms worsen or if the condition fails to improve as anticipated.  I provided 16 minutes of non-face-to-face time during this encounter.  It was my pleasure to participate in [place] care today. Please feel free to contact me with any questions or concerns.   Sincerely,  Rexene Alberts, DO Allergy & Immunology  [facility] office: [phone] [facility] office: [phone]

## 2019-01-31 NOTE — Patient Instructions (Addendum)
Asthma:   Daily controller medication(s):Symbicort 160 2 puffs twice a day with spacer and rinse mouth afterwards.  ? Continue Spiriva 2 puffs once a day. ? Continue Singulair 10mg  daily. ? Continue Fasenra injections.  Prior to physical activity:May use albuterol rescue inhaler 2 puffs 5 to 15 minutes prior to strenuous physical activities.  Rescue medications:May use albuterol rescue inhaler 2 puffs or nebulizer every 4 to 6 hours as needed for shortness of breath, chest tightness, coughing, and wheezing. Monitor frequency of use. Asthma control goals:  Full participation in all desired activities (may need albuterol before activity) Albuterol use two times or less a week on average (not counting use with activity) Cough interfering with sleep two times or less a month Oral steroids no more than once a year No hospitalizations  Other allergic rhinitis  May use over the counter antihistamines such as Zyrtec (cetirizine), Claritin (loratadine), Allegra (fexofenadine), or Xyzal (levocetirizine) daily as needed. May take twice a day if needed.   Continue appropriate allergen avoidance measures.  Continue Fluticasone 1 spray twice a day.  Continue azelastine 1-2 sprays twice a day as needed for runny nose.   Nasal saline spray (i.e., Simply Saline) or nasal saline lavage (i.e., NeilMed) is recommended as needed and prior to medicated nasal sprays.  Continue Singulair 10mg  daily.  Consider repeat testing in future.   Gastroesophageal reflux disease  Continue appropriate reflux lifestyle modifications and omeprazole as prescribed.  Follow up in 3 months

## 2019-01-31 NOTE — Assessment & Plan Note (Addendum)
Past history - 2014 spt positive to molds.  Interim history - doing much better with azelastine nasal spray.  May use over the counter antihistamines such as Zyrtec (cetirizine), Claritin (loratadine), Allegra (fexofenadine), or Xyzal (levocetirizine) daily as needed. May take twice a day if needed.   Continue appropriate allergen avoidance measures.  Continue Fluticasone 1 spray twice a day.  Continue azelastine 1-2 sprays twice a day as needed for runny nose.   Nasal saline spray (i.e., Simply Saline) or nasal saline lavage (i.e., NeilMed) is recommended as needed and prior to medicated nasal sprays.  Continue Singulair 10mg  daily.  Consider repeat testing in future.

## 2019-02-06 ENCOUNTER — Other Ambulatory Visit: Payer: Self-pay | Admitting: *Deleted

## 2019-02-06 MED ORDER — ALBUTEROL SULFATE HFA 108 (90 BASE) MCG/ACT IN AERS: INHALATION_SPRAY | RESPIRATORY_TRACT | 0 refills | Status: DC

## 2019-03-19 ENCOUNTER — Other Ambulatory Visit: Payer: Self-pay

## 2019-03-19 ENCOUNTER — Ambulatory Visit (INDEPENDENT_AMBULATORY_CARE_PROVIDER_SITE_OTHER): Payer: Commercial Managed Care - PPO | Admitting: *Deleted

## 2019-03-19 DIAGNOSIS — J4550 Severe persistent asthma, uncomplicated: Secondary | ICD-10-CM | POA: Diagnosis not present

## 2019-04-09 ENCOUNTER — Other Ambulatory Visit: Payer: Self-pay | Admitting: Allergy and Immunology

## 2019-04-26 ENCOUNTER — Other Ambulatory Visit: Payer: Self-pay | Admitting: Allergy

## 2019-05-03 ENCOUNTER — Other Ambulatory Visit: Payer: Self-pay | Admitting: Allergy

## 2019-05-09 ENCOUNTER — Ambulatory Visit: Payer: Commercial Managed Care - PPO | Admitting: Allergy

## 2019-05-16 ENCOUNTER — Ambulatory Visit: Payer: Commercial Managed Care - PPO

## 2019-05-16 ENCOUNTER — Ambulatory Visit (INDEPENDENT_AMBULATORY_CARE_PROVIDER_SITE_OTHER): Payer: Commercial Managed Care - PPO | Admitting: Allergy

## 2019-05-16 ENCOUNTER — Encounter: Payer: Self-pay | Admitting: Allergy

## 2019-05-16 ENCOUNTER — Other Ambulatory Visit: Payer: Self-pay

## 2019-05-16 VITALS — BP 130/82 | HR 86 | Temp 98.2°F | Resp 18 | Ht 65.5 in

## 2019-05-16 DIAGNOSIS — J3089 Other allergic rhinitis: Secondary | ICD-10-CM | POA: Diagnosis not present

## 2019-05-16 DIAGNOSIS — J4550 Severe persistent asthma, uncomplicated: Secondary | ICD-10-CM | POA: Diagnosis not present

## 2019-05-16 DIAGNOSIS — K219 Gastro-esophageal reflux disease without esophagitis: Secondary | ICD-10-CM

## 2019-05-16 NOTE — Assessment & Plan Note (Signed)
Was doing well up until last week when he started to have some chest congestion and coughing whitish phlegm. He thinks it was due to Clorox exposure while he was cleaning his bathroom. Symptoms have been improving.   Today's spirometry was normal and lungs clear on exam.   Advised patient to call if not feeling better by Monday. No indication for any prednisone at this time as he is feeling better.   Daily controller medication(s):Symbicort 160 2 puffs twice a day with spacer and rinse mouth afterwards.  ? Continue Spiriva 2 puffs once a day. ? Continue Singulair 10mg  daily. ? Continue Fasenra injections every 8 weeks.   Prior to physical activity:May use albuterol rescue inhaler 2 puffs 5 to 15 minutes prior to strenuous physical activities.  Rescue medications:May use albuterol rescue inhaler 2 puffs or nebulizer every 4 to 6 hours as needed for shortness of breath, chest tightness, coughing, and wheezing. Monitor frequency of use.

## 2019-05-16 NOTE — Patient Instructions (Addendum)
Other allergic rhinitis Past history - 2014 spt positive to molds.   May use over the counter antihistamines such as Zyrtec (cetirizine), Claritin (loratadine), Allegra (fexofenadine), or Xyzal (levocetirizine) daily as needed. May take twice a day if needed.   Continue appropriate allergen avoidance measures.  Continue Fluticasone 1 spray twice a day.  Continue azelastine 1-2 sprays twice a day as needed for runny nose.   Nasal saline spray (i.e., Simply Saline) or nasal saline lavage (i.e., NeilMed) is recommended as needed and prior to medicated nasal sprays.  Continue Singulair 10mg  daily.  Consider repeat testing in future.  Gastroesophageal reflux disease  Continue appropriate reflux lifestyle modifications and omeprazole as prescribed.  Severe persistent asthma without complication  If not feeling better by next Monday then give [place] a call.   Daily controller medication(s):Symbicort 160 2 puffs twice a day with spacer and rinse mouth afterwards.  ? Continue Spiriva 2 puffs once a day. ? Continue Singulair 10mg  daily. ? Continue Fasenra injections every 8 weeks.   Prior to physical activity:May use albuterol rescue inhaler 2 puffs 5 to 15 minutes prior to strenuous physical activities.  Rescue medications:May use albuterol rescue inhaler 2 puffs or nebulizer every 4 to 6 hours as needed for shortness of breath, chest tightness, coughing, and wheezing. Monitor frequency of use. Asthma control goals:  Full participation in all desired activities (may need albuterol before activity) Albuterol use two times or less a week on average (not counting use with activity) Cough interfering with sleep two times or less a month Oral steroids no more than once a year No hospitalizations  Follow up in 3-4 months

## 2019-05-16 NOTE — Progress Notes (Signed)
Follow Up Note  RE: Preston Taylor MRN: [id number] DOB: October 01, 1965 Date of Office Visit: 05/16/2019  Referring provider: No ref. provider found Primary care provider: Patient, No Pcp Per  Chief Complaint: Cough (congestion 1 week )  History of Present Illness: I had the pleasure of seeing [name] for a follow up visit at the [facility] [place] [facility] [place] on 05/16/2019. He is a 53 y.o. male, who is being followed for allergic rhinitis, GERD, asthma. Today he is here for regular follow up visit. His previous allergy office visit was on 01/31/2019 with Dr. Maudie Mercury via telemedicine.  Other allergic rhinitis Currently on xyzal daily, Flonase 1 spray  BID, azelastine 1 spray BID and no nose bleeds. Symptoms controlled.  Gastroesophageal reflux disease Well controlled with PPI.   Severe persistent asthma without complication Patient has been having some chest congestion for the past week and noticed some coughing with white mucous production at times.  No fevers or chills but he was cleaning with clorox about 1 week ago. Symptoms are improving. Used albuterol 3 times a day for the past week with good benefit. Currently on Symbicort 160 2 puffs BID, Spiriva 2 puffs daily, Singulair daily, Fasenra injection every 8 weeks.  No sick contacts.  Works out almost daily for about 1-2 hours with no issues.  Mucinex did not work for him in the past.   Assessment and Plan: Lelend is a 53 y.o. male with: Severe persistent asthma without complication Was doing well up until last week when he started to have some chest congestion and coughing whitish phlegm. He thinks it was due to Clorox exposure while he was cleaning his bathroom. Symptoms have been improving.   Today's spirometry was normal and lungs clear on exam.   Advised patient to call if not feeling better by Monday. No indication for any prednisone at this time as he is feeling better.   Daily controller medication(s):Symbicort 160 2 puffs twice a  day with spacer and rinse mouth afterwards.  ? Continue Spiriva 2 puffs once a day. ? Continue Singulair 10mg  daily. ? Continue Fasenra injections every 8 weeks.   Prior to physical activity:May use albuterol rescue inhaler 2 puffs 5 to 15 minutes prior to strenuous physical activities.  Rescue medications:May use albuterol rescue inhaler 2 puffs or nebulizer every 4 to 6 hours as needed for shortness of breath, chest tightness, coughing, and wheezing. Monitor frequency of use.  Other allergic rhinitis Past history - 2014 spt positive to molds.  Interim history - well controlled with below regimen.  May use over the counter antihistamines such as Zyrtec (cetirizine), Claritin (loratadine), Allegra (fexofenadine), or Xyzal (levocetirizine) daily as needed. May take twice a day if needed.   Continue appropriate allergen avoidance measures.  Continue Fluticasone 1 spray twice a day.  Continue azelastine 1-2 sprays twice a day as needed for runny nose.   Nasal saline spray (i.e., Simply Saline) or nasal saline lavage (i.e., NeilMed) is recommended as needed and prior to medicated nasal sprays.  Continue Singulair 10mg  daily.  Consider repeat testing in future.  Gastroesophageal reflux disease Controlled on PPI.  Continue appropriate reflux lifestyle modifications and omeprazole 20mg  BID as prescribed.  Return in about 3 months (around 08/15/2019).  Diagnostics: Spirometry:  Tracings reviewed. His effort: Good reproducible efforts. FVC: 3.73L FEV1: 3.29L, 110% predicted FEV1/FVC ratio: 88% Interpretation: Spirometry consistent with normal pattern.  Please see scanned spirometry results for details.  Medication List:  Current Outpatient Medications  Medication Sig Dispense Refill   albuterol (PROVENTIL) (2.5 MG/3ML) 0.083% nebulizer solution Use one vial in the nebulizer every 4-6 hours if needed for cough or wheeze 300 mL 0   albuterol (VENTOLIN HFA) 108 (90 Base)  MCG/ACT inhaler USE 2 INHALATIONS EVERY 4 TO 6 HOURS AS NEEDED FOR COUGH OR WHEEZING 25.5 g 0   amLODipine (NORVASC) 10 MG tablet      azelastine (ASTELIN) 0.1 % nasal spray USE 1 TO 2 SPRAYS IN EACH NOSTRIL UP TO TWICE DAILY AS NEEDED FOR RUNNY NOSE 30 mL 5   budesonide-formoterol (SYMBICORT) 160-4.5 MCG/ACT inhaler USE 2 INHALATIONS TWICE A DAY 3 Inhaler 2   FASENRA 30 MG/ML SOSY INJECT 30 MG (1 SYRINGE) UNDER THE SKIN EVERY 8 WEEKS 1 mL 5   fluticasone (FLONASE) 50 MCG/ACT nasal spray Place 2 sprays into both nostrils daily. 16 g 5   ipratropium-albuterol (DUONEB) 0.5-2.5 (3) MG/3ML SOLN USE 1 VIAL IN NEBULIZER EVERY 4 TO 6 HOURS IF NEEDED FOR COUGH OR WHEEZE 300 mL 10   levocetirizine (XYZAL) 5 MG tablet Take 1 tablet (5 mg total) by mouth 2 (two) times daily as needed for allergies. 60 tablet 5   losartan (COZAAR) 25 MG tablet      montelukast (SINGULAIR) 10 MG tablet TAKE 1 TABLET DAILY 90 tablet 0   omeprazole (PRILOSEC) 20 MG capsule Take 1 capsule (20 mg total) by mouth 2 (two) times daily. 180 capsule 1   simvastatin (ZOCOR) 10 MG tablet      Tiotropium Bromide Monohydrate (SPIRIVA RESPIMAT) 1.25 MCG/ACT AERS Inhale 2 puffs into the lungs daily. 3 Inhaler 2   hydrOXYzine (ATARAX/VISTARIL) 10 MG tablet TK 1 TO 2 TS PO TID PRF ITCHING     triamcinolone cream (KENALOG) 0.1 % APP EXT AA BID     Current Facility-Administered Medications  Medication Dose Route Frequency Provider Last Rate Last Dose   Benralizumab SOSY 30 mg  30 mg Subcutaneous Q8 Weeks Kozlow, Donnamarie Poag, MD   30 mg at 05/16/19 1451   Allergies: Allergies  Allergen Reactions   Lisinopril Nausea And Vomiting   I reviewed his past medical history, social history, family history, and environmental history and no significant changes have been reported from previous visit on 01/31/2019.  Review of Systems  Constitutional: Negative for appetite change, chills, fever and unexpected weight change.  HENT: Negative  for congestion, rhinorrhea and sneezing.   Eyes: Negative for itching.  Respiratory: Positive for cough. Negative for chest tightness, shortness of breath and wheezing.   Gastrointestinal: Negative for abdominal pain.  Skin: Negative for rash.  Allergic/Immunologic: Positive for environmental allergies.  Neurological: Negative for headaches.   Objective: BP 130/82    Pulse 86    Temp 98.2 F (36.8 C) (Temporal)    Resp 18    Ht 5' 5.5" (1.664 m)    SpO2 96%    BMI 25.89 kg/m  Body mass index is 25.89 kg/m. Physical Exam  Constitutional: He is oriented to person, place, and time. He appears well-developed and well-nourished.  HENT:  Head: Normocephalic and atraumatic.  Right Ear: External ear normal.  Left Ear: External ear normal.  Nose: Nose normal.  Mouth/Throat: Oropharynx is clear and moist.  Eyes: Conjunctivae and EOM are normal.  Neck: Neck supple.  Cardiovascular: Normal rate, regular rhythm and normal heart sounds. Exam reveals no gallop and no friction rub.  No murmur heard. Pulmonary/Chest: Effort normal and breath sounds normal. He has no wheezes. He has  no rales.  Neurological: He is alert and oriented to person, place, and time.  Skin: Skin is warm. No rash noted.  Psychiatric: He has a normal mood and affect. His behavior is normal.  Nursing note and vitals reviewed.  Previous notes and tests were reviewed. The plan was reviewed with the patient/family, and all questions/concerned were addressed.  It was my pleasure to see Preston Taylor today and participate in his care. Please feel free to contact me with any questions or concerns.  Sincerely,  Rexene Alberts, DO Allergy & Immunology  [facility] office: [phone] [facility] office: [place] office: [phone]

## 2019-05-16 NOTE — Assessment & Plan Note (Signed)
Controlled on PPI.  Continue appropriate reflux lifestyle modifications and omeprazole 20mg  BID as prescribed.

## 2019-05-16 NOTE — Assessment & Plan Note (Signed)
Past history - 2014 spt positive to molds.  Interim history - well controlled with below regimen.  May use over the counter antihistamines such as Zyrtec (cetirizine), Claritin (loratadine), Allegra (fexofenadine), or Xyzal (levocetirizine) daily as needed. May take twice a day if needed.   Continue appropriate allergen avoidance measures.  Continue Fluticasone 1 spray twice a day.  Continue azelastine 1-2 sprays twice a day as needed for runny nose.   Nasal saline spray (i.e., Simply Saline) or nasal saline lavage (i.e., NeilMed) is recommended as needed and prior to medicated nasal sprays.  Continue Singulair 10mg  daily.  Consider repeat testing in future.

## 2019-06-26 ENCOUNTER — Other Ambulatory Visit: Payer: Self-pay | Admitting: Allergy and Immunology

## 2019-07-11 ENCOUNTER — Other Ambulatory Visit: Payer: Self-pay

## 2019-07-11 ENCOUNTER — Ambulatory Visit (INDEPENDENT_AMBULATORY_CARE_PROVIDER_SITE_OTHER): Payer: Commercial Managed Care - PPO | Admitting: *Deleted

## 2019-07-11 DIAGNOSIS — J4550 Severe persistent asthma, uncomplicated: Secondary | ICD-10-CM | POA: Diagnosis not present

## 2019-08-01 ENCOUNTER — Other Ambulatory Visit: Payer: Self-pay | Admitting: Allergy

## 2019-09-05 ENCOUNTER — Ambulatory Visit (INDEPENDENT_AMBULATORY_CARE_PROVIDER_SITE_OTHER): Payer: Commercial Managed Care - PPO

## 2019-09-05 ENCOUNTER — Other Ambulatory Visit: Payer: Self-pay

## 2019-09-05 DIAGNOSIS — J4550 Severe persistent asthma, uncomplicated: Secondary | ICD-10-CM

## 2019-09-05 DIAGNOSIS — J4540 Moderate persistent asthma, uncomplicated: Secondary | ICD-10-CM

## 2019-09-17 ENCOUNTER — Other Ambulatory Visit: Payer: Self-pay

## 2019-09-17 ENCOUNTER — Ambulatory Visit (INDEPENDENT_AMBULATORY_CARE_PROVIDER_SITE_OTHER): Payer: Commercial Managed Care - PPO | Admitting: Allergy

## 2019-09-17 ENCOUNTER — Encounter: Payer: Self-pay | Admitting: Allergy

## 2019-09-17 VITALS — BP 142/70 | HR 99 | Temp 98.2°F | Resp 20 | Ht 66.0 in | Wt 161.1 lb

## 2019-09-17 DIAGNOSIS — J3089 Other allergic rhinitis: Secondary | ICD-10-CM

## 2019-09-17 DIAGNOSIS — K219 Gastro-esophageal reflux disease without esophagitis: Secondary | ICD-10-CM | POA: Diagnosis not present

## 2019-09-17 DIAGNOSIS — J4550 Severe persistent asthma, uncomplicated: Secondary | ICD-10-CM | POA: Diagnosis not present

## 2019-09-17 MED ORDER — ALBUTEROL SULFATE 108 (90 BASE) MCG/ACT IN AEPB: 2.0000 | INHALATION_SPRAY | RESPIRATORY_TRACT | 2 refills | Status: DC | PRN

## 2019-09-17 NOTE — Assessment & Plan Note (Signed)
Past history - 2014 spt positive to molds.  Interim history - stable with below regimen.  May use over the counter antihistamines such as Zyrtec (cetirizine), Claritin (loratadine), Allegra (fexofenadine), or Xyzal (levocetirizine) daily as needed. May take twice a day if needed.   Continue appropriate allergen avoidance measures.  Continue Fluticasone 1 spray twice a day.  Continue azelastine 1-2 sprays twice a day as needed for runny nose.   Nasal saline spray (i.e., Simply Saline) or nasal saline lavage (i.e., NeilMed) is recommended as needed and prior to medicated nasal sprays.  Continue Singulair 10mg  daily.  Consider repeat testing in future.

## 2019-09-17 NOTE — Progress Notes (Signed)
Follow Up Note  RE: Preston Taylor MRN: [id number] DOB: 06-14-1966 Date of Office Visit: 09/17/2019  Referring provider: No ref. provider found Primary care provider: Patient, No Pcp Per  Chief Complaint: Asthma  History of Present Illness: I had the pleasure of seeing Preston Taylor for a follow up visit at the [facility] [place] [facility] [place] on 09/17/2019. He is a 54 y.o. male, who is being followed for asthma, allergic rhinitis and GERD. His previous allergy office visit was on 05/16/2019 with Dr. Maudie Mercury. Today is a regular follow up visit.  Severe persistent asthma Uses albuterol 2-3 times usually after eating a big meal. Thinks it may be his reflux but using albuterol also seems to help. It really doesn't matter what he is eating.  Currently onthe following meds and doing well. However insurance is quoting him over $300 per inhalers which he can't afford.   Symbicort 160 2 puffs twice a day with spacer and rinse mouth afterwards.   Spiriva 2 puffs once a day.  Singulair 10mg  daily.  Fasenra injections every 8 weeks.   Other allergic rhinitis Some improvements with below regimen.   May use over the counter antihistamines such as Zyrtec (cetirizine), Claritin (loratadine), Allegra (fexofenadine), or Xyzal (levocetirizine) daily as needed. May take twice a day if needed.   Continue Fluticasone 1 spray twice a day.  Continue azelastine 1-2 sprays twice a day as needed for runny nose.   Continue Singulair 10mg  daily.  Gastroesophageal reflux disease Taking omeprazole 20mg  BID which is helping.   Assessment and Plan: Preston Taylor is a 54 y.o. male with: Severe persistent asthma without complication Doing okay with below regimen. Noticing some difficulty breathing after eating a big meal. Can't afford inhalers with new insurance - over $300 per inhaler.   Today's spirometry was normal and lungs clear on exam.   ACT score 22.   Eat smaller meals.   Daily controller medication(s):Symbicort  160 2 puffs twice a day with spacer and rinse mouth afterwards.  ? Continue Spiriva 2 puffs once a day. ? Continue Singulair 10mg  daily. ? Continue Fasenra injections every 8 weeks.   Prior to physical activity:May use albuterol rescue inhaler 2 puffs 5 to 15 minutes prior to strenuous physical activities.  Rescue medications:May use albuterol rescue inhaler 2 puffs or nebulizer every 4 to 6 hours as needed for shortness of breath, chest tightness, coughing, and wheezing. Monitor frequency of use.  Samples of Symbicort and Spiriva given. Gave a list of ICS/LABA inhaler equivalents and asked patient to check with his insurance which is the cheapest option for him.   Other allergic rhinitis Past history - 2014 spt positive to molds.  Interim history - stable with below regimen.  May use over the counter antihistamines such as Zyrtec (cetirizine), Claritin (loratadine), Allegra (fexofenadine), or Xyzal (levocetirizine) daily as needed. May take twice a day if needed.   Continue appropriate allergen avoidance measures.  Continue Fluticasone 1 spray twice a day.  Continue azelastine 1-2 sprays twice a day as needed for runny nose.   Nasal saline spray (i.e., Simply Saline) or nasal saline lavage (i.e., NeilMed) is recommended as needed and prior to medicated nasal sprays.  Continue Singulair 10mg  daily.  Consider repeat testing in future.  Gastroesophageal reflux disease Controlled on PPI.  Continue appropriate reflux lifestyle modifications and omeprazole 20mg  BID as prescribed.  Eat smaller meals.   Return in about 3 months (around 12/15/2019).  Meds ordered this encounter  Medications  .  Albuterol Sulfate 108 (90 Base) MCG/ACT AEPB    Sig: Inhale 2 puffs into the lungs every 4 (four) hours as needed. 2 puffs every 4-6 hours as needed for shortness of breath, wheezing or coughing.    Dispense:  3 each    Refill:  2   Diagnostics: Spirometry:  Tracings reviewed. His  effort: Good reproducible efforts. FVC: 3.62L FEV1: 3.18L, 109% predicted FEV1/FVC ratio: 88% Interpretation: Spirometry consistent with normal pattern.  Please see scanned spirometry results for details.  Medication List:  Current Outpatient Medications  Medication Sig Dispense Refill  . albuterol (PROVENTIL) (2.5 MG/3ML) 0.083% nebulizer solution Use one vial in the nebulizer every 4-6 hours if needed for cough or wheeze 300 mL 0  . amLODipine (NORVASC) 10 MG tablet     . azelastine (ASTELIN) 0.1 % nasal spray USE 1 TO 2 SPRAYS IN EACH NOSTRIL UP TO TWICE DAILY AS NEEDED FOR RUNNY NOSE 30 mL 5  . budesonide-formoterol (SYMBICORT) 160-4.5 MCG/ACT inhaler USE 2 INHALATIONS TWICE A DAY 3 Inhaler 2  . FASENRA 30 MG/ML SOSY INJECT 30 MG (1 SYRINGE) UNDER THE SKIN EVERY 8 WEEKS 1 mL 5  . fluticasone (FLONASE) 50 MCG/ACT nasal spray Place 2 sprays into both nostrils daily. 16 g 5  . ipratropium-albuterol (DUONEB) 0.5-2.5 (3) MG/3ML SOLN USE 1 VIAL IN NEBULIZER EVERY 4 TO 6 HOURS IF NEEDED FOR COUGH OR WHEEZE 300 mL 10  . levocetirizine (XYZAL) 5 MG tablet Take 1 tablet (5 mg total) by mouth 2 (two) times daily as needed for allergies. 60 tablet 5  . losartan (COZAAR) 25 MG tablet     . montelukast (SINGULAIR) 10 MG tablet TAKE 1 TABLET DAILY 90 tablet 3  . omeprazole (PRILOSEC) 20 MG capsule Take 1 capsule (20 mg total) by mouth 2 (two) times daily. 180 capsule 1  . simvastatin (ZOCOR) 10 MG tablet     . Tiotropium Bromide Monohydrate (SPIRIVA RESPIMAT) 1.25 MCG/ACT AERS Inhale 2 puffs into the lungs daily. 3 Inhaler 2  . triamcinolone cream (KENALOG) 0.1 % APP EXT AA BID    . Albuterol Sulfate 108 (90 Base) MCG/ACT AEPB Inhale 2 puffs into the lungs every 4 (four) hours as needed. 2 puffs every 4-6 hours as needed for shortness of breath, wheezing or coughing. 3 each 2  . hydrOXYzine (ATARAX/VISTARIL) 10 MG tablet TK 1 TO 2 TS PO TID PRF ITCHING     Current Facility-Administered Medications    Medication Dose Route Frequency Provider Last Rate Last Admin  . Benralizumab SOSY 30 mg  30 mg Subcutaneous Q8 Weeks Jiles Prows, MD   30 mg at 09/05/19 1502   Allergies: Allergies  Allergen Reactions  . Lisinopril Nausea And Vomiting   I reviewed his past medical history, social history, family history, and environmental history and no significant changes have been reported from his previous visit.  Review of Systems  Constitutional: Negative for appetite change, chills, fever and unexpected weight change.  HENT: Negative for congestion, rhinorrhea and sneezing.   Eyes: Negative for itching.  Respiratory: Negative for cough, chest tightness, shortness of breath and wheezing.   Gastrointestinal: Negative for abdominal pain.  Skin: Negative for rash.  Allergic/Immunologic: Positive for environmental allergies.  Neurological: Negative for headaches.   Objective: BP (!) 142/70 (BP Location: Left Arm, Patient Position: Sitting, Cuff Size: Normal)   Pulse 99   Temp 98.2 F (36.8 C)   Resp 20   Ht 5\' 6"  (1.676 m)   Wt  161 lb 1.6 oz (73.1 kg)   SpO2 96%   BMI 26.00 kg/m  Body mass index is 26 kg/m. Physical Exam  Constitutional: He is oriented to person, place, and time. He appears well-developed and well-nourished.  HENT:  Head: Normocephalic and atraumatic.  Right Ear: External ear normal.  Left Ear: External ear normal.  Nose: Nose normal.  Mouth/Throat: Oropharynx is clear and moist.  Eyes: Conjunctivae and EOM are normal.  Cardiovascular: Normal rate, regular rhythm and normal heart sounds. Exam reveals no gallop and no friction rub.  No murmur heard. Pulmonary/Chest: Effort normal and breath sounds normal. He has no wheezes. He has no rales.  Musculoskeletal:     Cervical back: Neck supple.  Neurological: He is alert and oriented to person, place, and time.  Skin: Skin is warm. No rash noted.  Psychiatric: He has a normal mood and affect. His behavior is normal.   Nursing note and vitals reviewed.  Previous notes and tests were reviewed. The plan was reviewed with the patient/family, and all questions/concerned were addressed.  It was my pleasure to see Preston Taylor today and participate in his care. Please feel free to contact me with any questions or concerns.  Sincerely,  Rexene Alberts, DO Allergy & Immunology  [facility] office: [phone] [facility] office: [place] office: [phone]

## 2019-09-17 NOTE — Assessment & Plan Note (Signed)
Controlled on PPI.  Continue appropriate reflux lifestyle modifications and omeprazole 20mg  BID as prescribed.  Eat smaller meals.

## 2019-09-17 NOTE — Patient Instructions (Addendum)
Call your insurance/pharmacy and see which one is the cheaper for you:  Dulera 200 2 puffs twice a day  Advair 230 2 puffs twice a day  Advair Diskus 500 1 puff twice a day  Wixela 500 1 puff twice a day  Breo 200 1 puff once a day  Breztri 2 puffs twice a day (this is a combination of Symbicort and Spiriva)  Severe persistent asthma  Eat smaller meals.   Today's spirometry was normal and lungs clear on exam.   Daily controller medication(s):Symbicort 160 2 puffs twice a day with spacer and rinse mouth afterwards.  ? Continue Spiriva 2 puffs once a day. ? Continue Singulair 10mg  daily. ? Continue Fasenra injections every 8 weeks.   Prior to physical activity:May use albuterol rescue inhaler 2 puffs 5 to 15 minutes prior to strenuous physical activities.  Rescue medications:May use albuterol rescue inhaler 2 puffs or nebulizer every 4 to 6 hours as needed for shortness of breath, chest tightness, coughing, and wheezing. Monitor frequency of use. Asthma control goals:  Full participation in all desired activities (may need albuterol before activity) Albuterol use two times or less a week on average (not counting use with activity) Cough interfering with sleep two times or less a month Oral steroids no more than once a year No hospitalizations  Other allergic rhinitis  May use over the counter antihistamines such as Zyrtec (cetirizine), Claritin (loratadine), Allegra (fexofenadine), or Xyzal (levocetirizine) daily as needed. May take twice a day if needed.   Continue appropriate allergen avoidance measures.  Continue Fluticasone 1 spray twice a day.  Continue azelastine 1-2 sprays twice a day as needed for runny nose.   Nasal saline spray (i.e., Simply Saline) or nasal saline lavage (i.e., NeilMed) is recommended as needed and prior to medicated nasal sprays.  Continue Singulair 10mg  daily.  Consider repeat testing in future.  Gastroesophageal reflux  disease  Continue appropriate reflux lifestyle modifications and omeprazole 20mg  BID as prescribed.  Follow up in 3 months or sooner if needed.

## 2019-09-17 NOTE — Assessment & Plan Note (Signed)
Doing okay with below regimen. Noticing some difficulty breathing after eating a big meal. Can't afford inhalers with new insurance - over $300 per inhaler.   Today's spirometry was normal and lungs clear on exam.   ACT score 22.   Eat smaller meals.   Daily controller medication(s):Symbicort 160 2 puffs twice a day with spacer and rinse mouth afterwards.  ? Continue Spiriva 2 puffs once a day. ? Continue Singulair 10mg  daily. ? Continue Fasenra injections every 8 weeks.   Prior to physical activity:May use albuterol rescue inhaler 2 puffs 5 to 15 minutes prior to strenuous physical activities.  Rescue medications:May use albuterol rescue inhaler 2 puffs or nebulizer every 4 to 6 hours as needed for shortness of breath, chest tightness, coughing, and wheezing. Monitor frequency of use.  Samples of Symbicort and Spiriva given. Gave a list of ICS/LABA inhaler equivalents and asked patient to check with his insurance which is the cheapest option for him.

## 2019-09-19 ENCOUNTER — Other Ambulatory Visit: Payer: Self-pay

## 2019-09-19 MED ORDER — ALBUTEROL SULFATE HFA 108 (90 BASE) MCG/ACT IN AERS: 2.0000 | INHALATION_SPRAY | RESPIRATORY_TRACT | 0 refills | Status: DC | PRN

## 2019-09-19 NOTE — Telephone Encounter (Signed)
Fax from [facility]:  ProAir Respiclick is not covered:   Alternatives include:  Albuterol HFA  Will switch to albuterol HFA

## 2019-09-20 ENCOUNTER — Other Ambulatory Visit: Payer: Self-pay | Admitting: *Deleted

## 2019-09-20 MED ORDER — ALBUTEROL SULFATE HFA 108 (90 BASE) MCG/ACT IN AERS: 2.0000 | INHALATION_SPRAY | RESPIRATORY_TRACT | 1 refills | Status: DC | PRN

## 2019-09-24 ENCOUNTER — Other Ambulatory Visit: Payer: Self-pay | Admitting: Allergy

## 2019-09-26 ENCOUNTER — Other Ambulatory Visit: Payer: Self-pay | Admitting: *Deleted

## 2019-09-26 MED ORDER — ALBUTEROL SULFATE HFA 108 (90 BASE) MCG/ACT IN AERS: 2.0000 | INHALATION_SPRAY | Freq: Four times a day (QID) | RESPIRATORY_TRACT | 1 refills | Status: DC | PRN

## 2019-10-01 ENCOUNTER — Other Ambulatory Visit: Payer: Self-pay | Admitting: Allergy and Immunology

## 2019-10-24 ENCOUNTER — Telehealth: Payer: Self-pay | Admitting: *Deleted

## 2019-10-24 NOTE — Telephone Encounter (Signed)
L/M for patient to contact [facility] for him to give ok to ship his Preston Taylor

## 2019-10-31 ENCOUNTER — Ambulatory Visit: Payer: Self-pay

## 2019-11-06 ENCOUNTER — Telehealth: Payer: Self-pay | Admitting: *Deleted

## 2019-11-06 NOTE — Telephone Encounter (Signed)
Called back patient and got him scheduled to come in this Friday to receive his injection.

## 2019-11-06 NOTE — Telephone Encounter (Signed)
Patient's Preston Taylor has been shipped to the [place] office, attempted to call patient twice but there was no answer and no voicemail. Will need to attempt to call patient again so that we can get him schedule to continue his Fasenra injections in the [place] office.

## 2019-11-09 ENCOUNTER — Other Ambulatory Visit: Payer: Self-pay

## 2019-11-09 ENCOUNTER — Ambulatory Visit (INDEPENDENT_AMBULATORY_CARE_PROVIDER_SITE_OTHER): Payer: Commercial Managed Care - PPO | Admitting: *Deleted

## 2019-11-09 DIAGNOSIS — J4550 Severe persistent asthma, uncomplicated: Secondary | ICD-10-CM | POA: Diagnosis not present

## 2019-12-17 ENCOUNTER — Ambulatory Visit: Payer: Commercial Managed Care - PPO | Admitting: Allergy

## 2020-01-04 ENCOUNTER — Ambulatory Visit: Payer: Commercial Managed Care - PPO | Admitting: Allergy

## 2020-01-04 ENCOUNTER — Encounter: Payer: Self-pay | Admitting: Allergy

## 2020-01-04 ENCOUNTER — Other Ambulatory Visit: Payer: Self-pay

## 2020-01-04 ENCOUNTER — Ambulatory Visit (INDEPENDENT_AMBULATORY_CARE_PROVIDER_SITE_OTHER): Payer: Commercial Managed Care - PPO | Admitting: *Deleted

## 2020-01-04 ENCOUNTER — Other Ambulatory Visit: Payer: Self-pay | Admitting: Allergy and Immunology

## 2020-01-04 VITALS — BP 130/80 | HR 81 | Temp 98.0°F | Resp 16 | Ht 65.5 in | Wt 162.8 lb

## 2020-01-04 DIAGNOSIS — K219 Gastro-esophageal reflux disease without esophagitis: Secondary | ICD-10-CM

## 2020-01-04 DIAGNOSIS — J3089 Other allergic rhinitis: Secondary | ICD-10-CM

## 2020-01-04 DIAGNOSIS — J4550 Severe persistent asthma, uncomplicated: Secondary | ICD-10-CM

## 2020-01-04 MED ORDER — OMEPRAZOLE 20 MG PO CPDR: 20.0000 mg | DELAYED_RELEASE_CAPSULE | Freq: Two times a day (BID) | ORAL | 1 refills | Status: DC

## 2020-01-04 MED ORDER — LEVOCETIRIZINE DIHYDROCHLORIDE 5 MG PO TABS: 5.0000 mg | ORAL_TABLET | Freq: Every evening | ORAL | 1 refills | Status: DC

## 2020-01-04 MED ORDER — ALBUTEROL SULFATE HFA 108 (90 BASE) MCG/ACT IN AERS: 2.0000 | INHALATION_SPRAY | RESPIRATORY_TRACT | 0 refills | Status: DC | PRN

## 2020-01-04 MED ORDER — TRELEGY ELLIPTA 200-62.5-25 MCG/INH IN AEPB: 1.0000 | INHALATION_SPRAY | Freq: Every day | RESPIRATORY_TRACT | 5 refills | Status: DC

## 2020-01-04 MED ORDER — AZELASTINE HCL 0.1 % NA SOLN: NASAL | 1 refills | Status: DC

## 2020-01-04 NOTE — Patient Instructions (Signed)
Severe persistent asthma   With current flare up after running out of inhaler medication  Today's spirometry was normal and lungs clear on exam.  ? Daily controller medication(s): stop Symbicort and Spiriva ? Start Trelegy 249mcg 1 puff once a day.  Sample provided today and coupon card provided for prescription ? Continue Singulair 10mg  daily. ? Continue Fasenra injections every 8 weeks.   Take prednisone pack as directed to get symptoms under control and decrease need for use of albuterol  Prior to physical activity:May use albuterol rescue inhaler 2 puffs 5 to 15 minutes prior to strenuous physical activities.  Rescue medications:May use albuterol rescue inhaler 2 puffs or nebulizer every 4 to 6 hours as needed for shortness of breath, chest tightness, coughing, and wheezing. Monitor frequency of use. Asthma control goals:  Full participation in all desired activities (may need albuterol before activity) Albuterol use two times or less a week on average (not counting use with activity) Cough interfering with sleep two times or less a month Oral steroids no more than once a year No hospitalizations  Other allergic rhinitis  Continue Xyzal (levocetirizine) 5mg  daily as needed. May take twice a day if needed.   Continue appropriate allergen avoidance measures.  Continue Fluticasone 1 spray twice a day.  Continue azelastine 1-2 sprays twice a day as needed for runny nose.   Nasal saline spray (i.e., Simply Saline) or nasal saline lavage (i.e., NeilMed) is recommended as needed and prior to medicated nasal sprays.  Continue Singulair 10mg  daily.  Consider repeat testing in future.  Gastroesophageal reflux disease  Continue appropriate reflux lifestyle modifications and omeprazole 20mg  BID as prescribed.  Follow up in 3-4 months or sooner if needed.

## 2020-01-04 NOTE — Progress Notes (Signed)
Follow-up Note  RE: Preston Taylor MRN: [id number] DOB: 28-Jan-1966 Date of Office Visit: 01/04/2020   History of present illness: Preston Taylor is a 54 y.o. male presenting today for follow-up of asthma, allergic rhinitis and reflux.  He was last seen in the office on 09/17/2019 by Dr. Maudie Mercury.  He states about 5 to 6 days ago he ran out of his Spiriva.  Since then he has been having more shortness of breath and chest tightness.  He has been needing his albuterol 3-4 times a day due to this.  He states he only has a little bit left of his Symbicort as well.  He does continue on Singulair daily.  He does continue on Fasenra injections every 8 weeks at this time.  Outside of running out of his medications he states his asthma was doing well on his medication regimen. He states his allergy symptoms are very well controlled with daily Xyzal and use of fluticasone and azelastine nasal sprays as well as the Singulair as above. He states his reflux is well controlled with use of omeprazole.  Review of systems: Review of Systems  Constitutional: Negative.   HENT: Negative.   Eyes: Negative.   Respiratory:       See HPI  Cardiovascular: Negative.   Gastrointestinal: Negative.   Musculoskeletal: Negative.   Skin: Negative.   Neurological: Negative.     All other systems negative unless noted above in HPI  Past medical/social/surgical/family history have been reviewed and are unchanged unless specifically indicated below.  No changes  Medication List: Current Outpatient Medications  Medication Sig Dispense Refill  . azelastine (ASTELIN) 0.1 % nasal spray USE 1 TO 2 SPRAYS IN EACH NOSTRIL UP TO TWICE DAILY AS NEEDED FOR RUNNY NOSE 90 mL 1  . FASENRA 30 MG/ML SOSY INJECT 30 MG (1 SYRINGE) UNDER THE SKIN EVERY 8 WEEKS 1 mL 5  . fluticasone (FLONASE) 50 MCG/ACT nasal spray Place 2 sprays into both nostrils daily. 16 g 5  . ipratropium-albuterol (DUONEB) 0.5-2.5 (3) MG/3ML SOLN USE 1 VIAL IN NEBULIZER EVERY 4  TO 6 HOURS IF NEEDED FOR COUGH OR WHEEZE 300 mL 10  . levocetirizine (XYZAL) 5 MG tablet Take 1 tablet (5 mg total) by mouth every evening. 90 tablet 1  . montelukast (SINGULAIR) 10 MG tablet TAKE 1 TABLET DAILY 90 tablet 3  . omeprazole (PRILOSEC) 20 MG capsule Take 1 capsule (20 mg total) by mouth 2 (two) times daily. 180 capsule 1  . triamcinolone cream (KENALOG) 0.1 % APP EXT AA BID    . albuterol (PROVENTIL) (2.5 MG/3ML) 0.083% nebulizer solution Use one vial in the nebulizer every 4-6 hours if needed for cough or wheeze 300 mL 0  . albuterol (VENTOLIN HFA) 108 (90 Base) MCG/ACT inhaler Inhale 2 puffs into the lungs every 4 (four) hours as needed. 54 g 0  . Albuterol Sulfate 108 (90 Base) MCG/ACT AEPB Inhale 2 puffs into the lungs every 4 (four) hours as needed. 2 puffs every 4-6 hours as needed for shortness of breath, wheezing or coughing. 3 each 2  . amLODipine (NORVASC) 10 MG tablet     . Fluticasone-Umeclidin-Vilant (TRELEGY ELLIPTA) 200-62.5-25 MCG/INH AEPB Inhale 1 puff into the lungs daily. 60 each 5  . hydrOXYzine (ATARAX/VISTARIL) 10 MG tablet TK 1 TO 2 TS PO TID PRF ITCHING    . losartan (COZAAR) 25 MG tablet     . simvastatin (ZOCOR) 10 MG tablet      Current Facility-Administered  Medications  Medication Dose Route Frequency Provider Last Rate Last Admin  . Benralizumab SOSY 30 mg  30 mg Subcutaneous Q8 Weeks Jiles Prows, MD   30 mg at 01/04/20 1400     Known medication allergies: Allergies  Allergen Reactions  . Lisinopril Nausea And Vomiting     Physical examination: Blood pressure 130/80, pulse 81, temperature 98 F (36.7 C), temperature source Temporal, resp. rate 16, height 5' 5.5" (1.664 m), weight 162 lb 12.8 oz (73.8 kg), SpO2 98 %.  General: Alert, interactive, in no acute distress. HEENT: PERRLA, TMs pearly gray, turbinates non-edematous without discharge, post-pharynx non erythematous. Neck: Supple without lymphadenopathy. Lungs: Clear to auscultation  without wheezing, rhonchi or rales. {no increased work of breathing. CV: Normal S1, S2 without murmurs. Abdomen: Nondistended, nontender. Skin: Warm and dry, without lesions or rashes. Extremities:  No clubbing, cyanosis or edema. Neuro:   Grossly intact.  Diagnositics/Labs:  Spirometry: FEV1: 3.07L 89%, FVC: 3.47L 80%, ratio consistent with Nonobstructive pattern   Assessment and plan:   Severe persistent asthma   With current flare up after running out of inhaler medication  Today's spirometry was normal and lungs clear on exam.  ? Daily controller medication(s): stop Symbicort and Spiriva ? Start Trelegy 275mcg 1 puff once a day.  Sample provided today and coupon card provided for prescription ? Continue Singulair 10mg  daily. ? Continue Fasenra injections every 8 weeks.   Take prednisone pack as directed to get symptoms under control and decrease need for use of albuterol  Prior to physical activity:May use albuterol rescue inhaler 2 puffs 5 to 15 minutes prior to strenuous physical activities.  Rescue medications:May use albuterol rescue inhaler 2 puffs or nebulizer every 4 to 6 hours as needed for shortness of breath, chest tightness, coughing, and wheezing. Monitor frequency of use. Asthma control goals:  Full participation in all desired activities (may need albuterol before activity) Albuterol use two times or less a week on average (not counting use with activity) Cough interfering with sleep two times or less a month Oral steroids no more than once a year No hospitalizations  Other allergic rhinitis  Continue Xyzal (levocetirizine) 5mg  daily as needed. May take twice a day if needed.   Continue appropriate allergen avoidance measures.  Continue Fluticasone 1 spray twice a day.  Continue azelastine 1-2 sprays twice a day as needed for runny nose.   Nasal saline spray (i.e., Simply Saline) or nasal saline lavage (i.e., NeilMed) is recommended as needed and  prior to medicated nasal sprays.  Continue Singulair 10mg  daily.  Consider repeat testing in future.  Gastroesophageal reflux disease  Continue appropriate reflux lifestyle modifications and omeprazole 20mg  BID as prescribed.  Follow up in 3-4 months or sooner if needed.   I appreciate the opportunity to take part in Alessio's care. Please do not hesitate to contact me with questions.  Sincerely,   Prudy Feeler, MD Allergy/Immunology [facility]

## 2020-01-07 ENCOUNTER — Other Ambulatory Visit: Payer: Self-pay

## 2020-01-11 ENCOUNTER — Telehealth: Payer: Self-pay | Admitting: *Deleted

## 2020-01-11 NOTE — Telephone Encounter (Signed)
Ok.   If he has not tried zyrtec or claritin before then we can send in Zyrtec.  If he has tried zyrtec or claritin and failed then see if we can get Xyzal approved.   Otherwise will need to buy OTC.

## 2020-01-11 NOTE — Telephone Encounter (Signed)
Called and spoke with patient and he stated that he did try and fail Zyrtec. He stated that he buys his Xyzal over the counter and does not need a prescription.

## 2020-01-11 NOTE — Telephone Encounter (Signed)
Patient's pharmacy [facility] called stating that Levocetirizine is not covered and that the preferred alternatives are zyrtec or claritin. Please advise change in medication or if you would like for the patient to purchase over the counter.

## 2020-02-29 ENCOUNTER — Other Ambulatory Visit: Payer: Self-pay

## 2020-02-29 ENCOUNTER — Ambulatory Visit (INDEPENDENT_AMBULATORY_CARE_PROVIDER_SITE_OTHER): Payer: Commercial Managed Care - PPO

## 2020-02-29 DIAGNOSIS — J4550 Severe persistent asthma, uncomplicated: Secondary | ICD-10-CM

## 2020-03-17 ENCOUNTER — Other Ambulatory Visit: Payer: Self-pay | Admitting: Allergy

## 2020-03-18 ENCOUNTER — Telehealth: Payer: Self-pay | Admitting: Allergy

## 2020-03-18 MED ORDER — SPIRIVA RESPIMAT 1.25 MCG/ACT IN AERS
2.0000 | INHALATION_SPRAY | Freq: Every day | RESPIRATORY_TRACT | 2 refills | Status: DC
Start: 2020-03-18 — End: 2020-03-19

## 2020-03-18 MED ORDER — BUDESONIDE-FORMOTEROL FUMARATE 160-4.5 MCG/ACT IN AERO
2.0000 | INHALATION_SPRAY | Freq: Two times a day (BID) | RESPIRATORY_TRACT | 2 refills | Status: DC
Start: 2020-03-18 — End: 2020-03-19

## 2020-03-18 NOTE — Telephone Encounter (Signed)
Okay to switch back. Will send in Rx. However, at last 2 visit he said that his new insurance would not cover these inhalers.   If having issues with coverage, let us know.  Thank you.

## 2020-03-18 NOTE — Telephone Encounter (Signed)
Left a detailed message informing patient that the Spiriva and Symbicort were sent in and if he has any issues to give [place] a call back.

## 2020-03-18 NOTE — Telephone Encounter (Signed)
Dr. Maudie Mercury will this be okay to switch back to Symbicort and Spiriva.

## 2020-03-18 NOTE — Telephone Encounter (Signed)
Patient called and said that the Trelegy was not working and would like to go back Spiriva and Symbicort . [facility] [phone]

## 2020-03-19 ENCOUNTER — Telehealth: Payer: Self-pay | Admitting: Allergy

## 2020-03-19 MED ORDER — BUDESONIDE-FORMOTEROL FUMARATE 160-4.5 MCG/ACT IN AERO: 2.0000 | INHALATION_SPRAY | Freq: Two times a day (BID) | RESPIRATORY_TRACT | 1 refills | Status: DC

## 2020-03-19 MED ORDER — SPIRIVA RESPIMAT 1.25 MCG/ACT IN AERS: 2.0000 | INHALATION_SPRAY | Freq: Every day | RESPIRATORY_TRACT | 1 refills | Status: DC

## 2020-03-19 NOTE — Telephone Encounter (Signed)
Yes is okay to go back to Spiriva and Symbicort.  We were trying to simplify his regimen but if Trelegy is not working we can go back to the combination that was.

## 2020-03-19 NOTE — Telephone Encounter (Signed)
Patient informed and prescriptions sent.

## 2020-03-19 NOTE — Telephone Encounter (Signed)
Patient said he was switched from spiriva and Symbicort to trelogy. He said the trelogy is not working and wants to know if he can switch back. He wants to go through [employer].

## 2020-03-19 NOTE — Telephone Encounter (Signed)
Dr Padgett please advise 

## 2020-04-25 ENCOUNTER — Ambulatory Visit (INDEPENDENT_AMBULATORY_CARE_PROVIDER_SITE_OTHER): Payer: Commercial Managed Care - PPO

## 2020-04-25 ENCOUNTER — Other Ambulatory Visit: Payer: Self-pay

## 2020-04-25 DIAGNOSIS — J4550 Severe persistent asthma, uncomplicated: Secondary | ICD-10-CM | POA: Diagnosis not present

## 2020-05-07 ENCOUNTER — Ambulatory Visit: Payer: Commercial Managed Care - PPO | Admitting: Allergy

## 2020-05-15 ENCOUNTER — Other Ambulatory Visit: Payer: Self-pay | Admitting: Allergy

## 2020-05-28 ENCOUNTER — Other Ambulatory Visit: Payer: Self-pay

## 2020-05-28 ENCOUNTER — Ambulatory Visit (INDEPENDENT_AMBULATORY_CARE_PROVIDER_SITE_OTHER): Payer: Commercial Managed Care - PPO | Admitting: Allergy

## 2020-05-28 ENCOUNTER — Encounter: Payer: Self-pay | Admitting: Allergy

## 2020-05-28 VITALS — BP 124/80 | HR 94 | Temp 97.2°F | Resp 14 | Ht 67.0 in | Wt 156.0 lb

## 2020-05-28 DIAGNOSIS — K219 Gastro-esophageal reflux disease without esophagitis: Secondary | ICD-10-CM | POA: Diagnosis not present

## 2020-05-28 DIAGNOSIS — J3089 Other allergic rhinitis: Secondary | ICD-10-CM

## 2020-05-28 DIAGNOSIS — J4550 Severe persistent asthma, uncomplicated: Secondary | ICD-10-CM | POA: Diagnosis not present

## 2020-05-28 MED ORDER — MONTELUKAST SODIUM 10 MG PO TABS
10.0000 mg | ORAL_TABLET | Freq: Every day | ORAL | 3 refills | Status: DC
Start: 2020-05-28 — End: 2020-12-18

## 2020-05-28 MED ORDER — ALBUTEROL SULFATE (2.5 MG/3ML) 0.083% IN NEBU: INHALATION_SOLUTION | RESPIRATORY_TRACT | 0 refills | Status: DC

## 2020-05-28 MED ORDER — AZELASTINE HCL 0.1 % NA SOLN: NASAL | 1 refills | Status: DC

## 2020-05-28 MED ORDER — LEVOCETIRIZINE DIHYDROCHLORIDE 5 MG PO TABS
5.0000 mg | ORAL_TABLET | Freq: Every evening | ORAL | 1 refills | Status: DC
Start: 2020-05-28 — End: 2021-02-19

## 2020-05-28 MED ORDER — ALBUTEROL SULFATE HFA 108 (90 BASE) MCG/ACT IN AERS: INHALATION_SPRAY | RESPIRATORY_TRACT | 1 refills | Status: DC

## 2020-05-28 MED ORDER — BUDESONIDE-FORMOTEROL FUMARATE 160-4.5 MCG/ACT IN AERO: 2.0000 | INHALATION_SPRAY | Freq: Two times a day (BID) | RESPIRATORY_TRACT | 3 refills | Status: DC

## 2020-05-28 MED ORDER — FLUTICASONE PROPIONATE 50 MCG/ACT NA SUSP: 2.0000 | Freq: Every day | NASAL | 5 refills | Status: DC

## 2020-05-28 MED ORDER — SPIRIVA RESPIMAT 1.25 MCG/ACT IN AERS
2.0000 | INHALATION_SPRAY | Freq: Every day | RESPIRATORY_TRACT | 3 refills | Status: DC
Start: 2020-05-28 — End: 2020-12-18

## 2020-05-28 NOTE — Progress Notes (Signed)
Follow-up Note  RE: Preston Taylor MRN: [id number] DOB: 11-Aug-1966 Date of Office Visit: 05/28/2020   History of present illness: Preston Taylor is a 54 y.o. male presenting today for follow-up of asthma, allergic rhinitis and reflux.  He was last seen in the office on 01/04/2020 by myself.  In an effort to simplify his asthma regimen I recommended he try Trelegy to replace Symbicort and Spiriva.  He states the Trelegy did not control his symptoms well.  He states he had worsening breathing and increase in albuterol use with the Trelegy.  Thus he did call back to the office to asked to be switched back.  We provided him with refills for the Symbicort and Spiriva which he has been doing since.  He takes 2 puffs twice a day of Symbicort and 2 puffs once a day of Spiriva and he states this has been controlling his symptoms well.  He continues to do Fasenra injections every 8 weeks without any complications.  He has not required any further systemic steroid since last visit or any ED or urgent care visits. He states his allergy symptoms are controlled with Xyzal and Singulair and his nasal sprays in the morning including Flonase and azelastine. He denies any significant reflux symptoms at this time.      Review of systems: Review of Systems  Constitutional: Negative.   HENT: Negative.   Eyes: Negative.   Respiratory: Negative.   Cardiovascular: Negative.   Gastrointestinal: Negative.   Musculoskeletal: Negative.   Skin: Negative.   Neurological: Negative.     All other systems negative unless noted above in HPI  Past medical/social/surgical/family history have been reviewed and are unchanged unless specifically indicated below.  No changes  Medication List: Current Outpatient Medications  Medication Sig Dispense Refill  . albuterol (PROVENTIL) (2.5 MG/3ML) 0.083% nebulizer solution Use one vial in the nebulizer every 4-6 hours if needed for cough or wheeze 300 mL 0  . albuterol (VENTOLIN HFA) 108  (90 Base) MCG/ACT inhaler USE 2 INHALATIONS EVERY 4 HOURS AS NEEDED for cough, wheeze, shortness of breath or chest tightness 6.7 g 1  . amLODipine (NORVASC) 10 MG tablet     . azelastine (ASTELIN) 0.1 % nasal spray USE 1 TO 2 SPRAYS IN EACH NOSTRIL UP TO TWICE A DAY AS NEEDED FOR RUNNY NOSE. 30 mL 1  . budesonide-formoterol (SYMBICORT) 160-4.5 MCG/ACT inhaler Inhale 2 puffs into the lungs in the morning and at bedtime. with spacer and rinse mouth afterwards. 1 each 3  . FASENRA 30 MG/ML SOSY INJECT 30 MG (1 SYRINGE) UNDER THE SKIN EVERY 8 WEEKS 1 mL 5  . fluticasone (FLONASE) 50 MCG/ACT nasal spray Place 2 sprays into both nostrils daily. 16 g 5  . hydrOXYzine (ATARAX/VISTARIL) 10 MG tablet TK 1 TO 2 TS PO TID PRF ITCHING    . ipratropium-albuterol (DUONEB) 0.5-2.5 (3) MG/3ML SOLN USE 1 VIAL VIA NEBULIZER EVERY 4 TO 6 HOURS IF NEEDED FOR COUGH OR WHEEZE 300 mL 10  . levocetirizine (XYZAL) 5 MG tablet Take 1 tablet (5 mg total) by mouth every evening. 90 tablet 1  . losartan (COZAAR) 25 MG tablet     . montelukast (SINGULAIR) 10 MG tablet Take 1 tablet (10 mg total) by mouth daily. 90 tablet 3  . omeprazole (PRILOSEC) 20 MG capsule Take 1 capsule (20 mg total) by mouth 2 (two) times daily. 180 capsule 1  . simvastatin (ZOCOR) 10 MG tablet     . Tiotropium Bromide  Monohydrate (SPIRIVA RESPIMAT) 1.25 MCG/ACT AERS Inhale 2 puffs into the lungs daily. 12 g 3  . triamcinolone cream (KENALOG) 0.1 % APP EXT AA BID     Current Facility-Administered Medications  Medication Dose Route Frequency Provider Last Rate Last Admin  . Benralizumab SOSY 30 mg  30 mg Subcutaneous Q8 Weeks Jiles Prows, MD   30 mg at 04/25/20 7262     Known medication allergies: Allergies  Allergen Reactions  . Lisinopril Nausea And Vomiting     Physical examination: Blood pressure 124/80, pulse 94, temperature (!) 97.2 F (36.2 C), resp. rate 14, height 5\' 7"  (1.702 m), weight 156 lb (70.8 kg), SpO2 97 %.  General:  Alert, interactive, in no acute distress. HEENT: PERRLA, TMs pearly gray, turbinates minimally edematous without discharge, post-pharynx non erythematous. Neck: Supple without lymphadenopathy. Lungs: Clear to auscultation without wheezing, rhonchi or rales. {no increased work of breathing. CV: Normal S1, S2 without murmurs. Abdomen: Nondistended, nontender. Skin: Warm and dry, without lesions or rashes. Extremities:  No clubbing, cyanosis or edema. Neuro:   Grossly intact.  Diagnositics/Labs: None today  Assessment and plan:   Severe persistent asthma     Under good control at this time ? Daily controller medication(s):Continue Symbicort 2 puffs twice a day and Spiriva 2 puffs once a day ? Continue Singulair 10mg  daily. ? Continue Fasenra injections every 8 weeks.   Prior to physical activity:May use albuterol rescue inhaler 2 puffs 5 to 15 minutes prior to strenuous physical activities.  Rescue medications:May use albuterol rescue inhaler 2 puffs or nebulizer every 4 to 6 hours as needed for shortness of breath, chest tightness, coughing, and wheezing. Monitor frequency of use. Asthma control goals:  Full participation in all desired activities (may need albuterol before activity) Albuterol use two times or less a week on average (not counting use with activity) Cough interfering with sleep two times or less a month Oral steroids no more than once a year No hospitalizations  Other allergic rhinitis  Continue Xyzal (levocetirizine) 5mg  daily as needed. May take twice a day if needed.   Continue appropriate allergen avoidance measures.  Continue Fluticasone 1 spray twice a day.  Continue azelastine 1-2 sprays twice a day as needed for runny nose.   Nasal saline spray (i.e., Simply Saline) or nasal saline lavage (i.e., NeilMed) is recommended as needed and prior to medicated nasal sprays.  Continue Singulair 10mg  daily.  Consider repeat testing in  future.  Gastroesophageal reflux disease  Continue appropriate reflux lifestyle modifications and omeprazole if needed  Follow up in 4-6 months or sooner if needed.   I appreciate the opportunity to take part in Preston Taylor's care. Please do not hesitate to contact me with questions.  Sincerely,   Prudy Feeler, MD Allergy/[facility]ology [facility]

## 2020-05-28 NOTE — Patient Instructions (Addendum)
Severe persistent asthma     Under good control at this time ? Daily controller medication(s):Continue Symbicort 2 puffs twice a day and Spiriva 2 puffs once a day ? Continue Singulair 10mg  daily. ? Continue Fasenra injections every 8 weeks.   Prior to physical activity:May use albuterol rescue inhaler 2 puffs 5 to 15 minutes prior to strenuous physical activities.  Rescue medications:May use albuterol rescue inhaler 2 puffs or nebulizer every 4 to 6 hours as needed for shortness of breath, chest tightness, coughing, and wheezing. Monitor frequency of use. Asthma control goals:  Full participation in all desired activities (may need albuterol before activity) Albuterol use two times or less a week on average (not counting use with activity) Cough interfering with sleep two times or less a month Oral steroids no more than once a year No hospitalizations  Other allergic rhinitis  Continue Xyzal (levocetirizine) 5mg  daily as needed. May take twice a day if needed.   Continue appropriate allergen avoidance measures.  Continue Fluticasone 1 spray twice a day.  Continue azelastine 1-2 sprays twice a day as needed for runny nose.   Nasal saline spray (i.e., Simply Saline) or nasal saline lavage (i.e., NeilMed) is recommended as needed and prior to medicated nasal sprays.  Continue Singulair 10mg  daily.  Consider repeat testing in future.  Gastroesophageal reflux disease  Continue appropriate reflux lifestyle modifications and omeprazole if needed  Follow up in 4-6 months or sooner if needed.

## 2020-06-03 ENCOUNTER — Telehealth: Payer: Self-pay | Admitting: Allergy

## 2020-06-03 ENCOUNTER — Telehealth: Payer: Self-pay

## 2020-06-03 MED ORDER — ALBUTEROL SULFATE HFA 108 (90 BASE) MCG/ACT IN AERS: 2.0000 | INHALATION_SPRAY | RESPIRATORY_TRACT | 3 refills | Status: DC | PRN

## 2020-06-03 NOTE — Telephone Encounter (Signed)
Will sent in for the proair but left pt a message need to know if he has tried any other antihistamines (claritin, allegra, zyrtec)

## 2020-06-03 NOTE — Telephone Encounter (Signed)
Patient said that he has try the other antih. And they did not work

## 2020-06-03 NOTE — Telephone Encounter (Signed)
[facility] called to follow up on a fax that was sent over yesterday. Ventolin is not cover, but Albuterol ProAir is. Patient must try and fail two other medications before the levocetirizine.   Call back number is [phone] and reference number is [id number].  Please advise.

## 2020-06-03 NOTE — Telephone Encounter (Signed)
Noted thank you

## 2020-06-03 NOTE — Telephone Encounter (Signed)
Per patsy pt has tried zyrtec, claritin and allegra with no symptom relief I will do the pa for levocetirizine.

## 2020-06-13 ENCOUNTER — Other Ambulatory Visit: Payer: Self-pay

## 2020-06-13 ENCOUNTER — Telehealth: Payer: Self-pay | Admitting: Allergy

## 2020-06-13 MED ORDER — ALBUTEROL SULFATE HFA 108 (90 BASE) MCG/ACT IN AERS: 2.0000 | INHALATION_SPRAY | RESPIRATORY_TRACT | 3 refills | Status: DC | PRN

## 2020-06-13 NOTE — Telephone Encounter (Signed)
Sent in refill

## 2020-06-13 NOTE — Telephone Encounter (Signed)
Patient needs refill on albuterol rescue inhaler sent to [facility].  Please advise.

## 2020-06-16 ENCOUNTER — Other Ambulatory Visit: Payer: Self-pay | Admitting: Allergy

## 2020-06-25 ENCOUNTER — Ambulatory Visit (INDEPENDENT_AMBULATORY_CARE_PROVIDER_SITE_OTHER): Payer: Commercial Managed Care - PPO | Admitting: *Deleted

## 2020-06-25 DIAGNOSIS — J4550 Severe persistent asthma, uncomplicated: Secondary | ICD-10-CM

## 2020-06-25 NOTE — Telephone Encounter (Signed)
Patient informed that inhaler was sent and he is contacting the pharmacy again.

## 2020-06-25 NOTE — Telephone Encounter (Signed)
Patient states that [facility] left him a message stating they could not refill this inhaler and he does not know why.

## 2020-06-27 ENCOUNTER — Ambulatory Visit: Payer: Self-pay

## 2020-08-20 ENCOUNTER — Ambulatory Visit (INDEPENDENT_AMBULATORY_CARE_PROVIDER_SITE_OTHER): Payer: Commercial Managed Care - PPO

## 2020-08-20 DIAGNOSIS — J4550 Severe persistent asthma, uncomplicated: Secondary | ICD-10-CM

## 2020-09-12 ENCOUNTER — Other Ambulatory Visit: Payer: Self-pay | Admitting: Allergy

## 2020-09-24 ENCOUNTER — Other Ambulatory Visit: Payer: Self-pay | Admitting: Allergy and Immunology

## 2020-10-15 ENCOUNTER — Other Ambulatory Visit: Payer: Self-pay

## 2020-10-15 ENCOUNTER — Ambulatory Visit (INDEPENDENT_AMBULATORY_CARE_PROVIDER_SITE_OTHER): Payer: Commercial Managed Care - PPO | Admitting: *Deleted

## 2020-10-15 DIAGNOSIS — J4550 Severe persistent asthma, uncomplicated: Secondary | ICD-10-CM

## 2020-10-27 ENCOUNTER — Other Ambulatory Visit: Payer: Self-pay | Admitting: Allergy

## 2020-12-10 ENCOUNTER — Other Ambulatory Visit: Payer: Self-pay

## 2020-12-10 ENCOUNTER — Ambulatory Visit (INDEPENDENT_AMBULATORY_CARE_PROVIDER_SITE_OTHER): Payer: Commercial Managed Care - PPO

## 2020-12-10 DIAGNOSIS — J4550 Severe persistent asthma, uncomplicated: Secondary | ICD-10-CM | POA: Diagnosis not present

## 2020-12-11 ENCOUNTER — Other Ambulatory Visit: Payer: Self-pay | Admitting: Allergy

## 2020-12-18 ENCOUNTER — Encounter: Payer: Self-pay | Admitting: Family Medicine

## 2020-12-18 ENCOUNTER — Other Ambulatory Visit: Payer: Self-pay

## 2020-12-18 ENCOUNTER — Ambulatory Visit (INDEPENDENT_AMBULATORY_CARE_PROVIDER_SITE_OTHER): Payer: Commercial Managed Care - PPO | Admitting: Family Medicine

## 2020-12-18 VITALS — BP 126/82 | HR 80 | Temp 98.2°F | Resp 16

## 2020-12-18 DIAGNOSIS — J4550 Severe persistent asthma, uncomplicated: Secondary | ICD-10-CM | POA: Diagnosis not present

## 2020-12-18 DIAGNOSIS — H1013 Acute atopic conjunctivitis, bilateral: Secondary | ICD-10-CM

## 2020-12-18 DIAGNOSIS — K219 Gastro-esophageal reflux disease without esophagitis: Secondary | ICD-10-CM

## 2020-12-18 DIAGNOSIS — H1010 Acute atopic conjunctivitis, unspecified eye: Secondary | ICD-10-CM

## 2020-12-18 DIAGNOSIS — J3089 Other allergic rhinitis: Secondary | ICD-10-CM | POA: Diagnosis not present

## 2020-12-18 MED ORDER — MONTELUKAST SODIUM 10 MG PO TABS: 10.0000 mg | ORAL_TABLET | Freq: Every day | ORAL | 1 refills | Status: DC

## 2020-12-18 MED ORDER — BUDESONIDE-FORMOTEROL FUMARATE 160-4.5 MCG/ACT IN AERO: INHALATION_SPRAY | RESPIRATORY_TRACT | 1 refills | Status: DC

## 2020-12-18 MED ORDER — ALBUTEROL SULFATE HFA 108 (90 BASE) MCG/ACT IN AERS: 2.0000 | INHALATION_SPRAY | RESPIRATORY_TRACT | 1 refills | Status: DC | PRN

## 2020-12-18 MED ORDER — SPIRIVA RESPIMAT 1.25 MCG/ACT IN AERS: 2.0000 | INHALATION_SPRAY | Freq: Every day | RESPIRATORY_TRACT | 1 refills | Status: DC

## 2020-12-18 NOTE — Patient Instructions (Addendum)
Asthma Continue montelukast 10 mg once a day to prevent cough or wheeze Continue Symbicort 160-2 puffs twice a day to prevent cough or wheeze Continue Spiriva 1.25 mcg - 2 puffs once a day to prevent cough or wheeze Continue albuterol 2 puffs every 4 hours as needed for cough or wheeze OR Instead use albuterol 0.083% solution via nebulizer one unit vial every 4 hours as needed for cough or wheeze Continue Fasenra 30 mg injections once every 8 weeks and have access to an epinephrine auto-injector  Allergic rhinitis Continue levocetirizine 5 mg once a day as needed for runny nose or itch Continue Flonase 2 sprays in each nostril once a day as needed for stuffy nose. In the right nostril, point the applicator out toward the right ear. In the left nostril, point the applicator out toward the left ear Consider saline nasal rinses as needed for nasal symptoms. Use this before any medicated nasal sprays for best result Continue azelastine 2 sprays in each nostril twice a day as needed for runny nose  Allergic conjunctivitis Some over the counter eye drops include Pataday one drop in each eye once a day as needed for red, itchy eyes OR Zaditor one drop in each eye twice a day as needed for red itchy eyes.  Reflux Continue dietary lifestyle modifications as listed below Continue omeprazole 20 mg twice a day to prevent reflux  Call the clinic if this treatment plan is not working well for you  Follow up in 2 months or sooner if needed.   Lifestyle Changes for Controlling GERD When you have GERD, stomach acid feels as if it's backing up toward your mouth. Whether or not you take medication to control your GERD, your symptoms can often be improved with lifestyle changes.   Raise Your Head  Reflux is more likely to strike when you're lying down flat, because stomach fluid can  flow backward more easily. Raising the head of your bed 4-6 inches can help. To do this:  Slide blocks or books under  the legs at the head of your bed. Or, place a wedge under  the mattress. Many foam stores can make a suitable wedge for you. The wedge  should run from your waist to the top of your head.  Don't just prop your head on several pillows. This increases pressure on your  stomach. It can make GERD worse.  Watch Your Eating Habits Certain foods may increase the acid in your stomach or relax the lower esophageal sphincter, making GERD more likely. It's best to avoid the following:  Coffee, tea, and carbonated drinks (with and without caffeine)  Fatty, fried, or spicy food  Mint, chocolate, onions, and tomatoes  Any other foods that seem to irritate your stomach or cause you pain  Relieve the Pressure  Eat smaller meals, even if you have to eat more often.  Don't lie down right after you eat. Wait a few hours for your stomach to empty.  Avoid tight belts and tight-fitting clothes.  Lose excess weight.  Tobacco and Alcohol  Avoid smoking tobacco and drinking alcohol. They can make GERD symptoms worse.

## 2020-12-18 NOTE — Progress Notes (Signed)
[place] [address] Dept: [phone]  FOLLOW UP NOTE  Patient ID: Maks Cavallero, male    DOB: [birth year]  Age: 55 y.o. MRN: [id number] Date of Office Visit: 12/18/2020  Assessment  Chief Complaint: Asthma  HPI Carrie Schoonmaker is a 55 year old male who presents to the clinic for follow-up visit.  He was last seen in this clinic on 05/28/2020 by Dr. Nelva Bush for evaluation of asthma, allergic rhinitis, and reflux.  At today's visit, he reports his asthma has been well controlled with no shortness of breath, cough, or wheeze with activity or rest.  He continues montelukast 10 mg once a day, Symbicort 160-2 puffs twice a day with a spacer, Spiriva 2 puffs once a day, and albuterol about 2 to 3 days a week with relief of symptoms.  He continues Fasenra injections once every 8 weeks.  He reports Fasenra injections significantly reduce his symptoms of asthma.  Allergic rhinitis is reported as well controlled with daily Flonase and azelastine.  He is not currently using a saline nasal rinse.  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  Reflux is reported as well controlled with omeprazole daily.  His current medications are listed in the chart.   Drug Allergies:  Allergies  Allergen Reactions  . Lisinopril Nausea And Vomiting    Physical Exam: BP 126/82   Pulse 80   Temp 98.2 F (36.8 C) (Temporal)   Resp 16   SpO2 96%    Physical Exam Vitals reviewed.  Constitutional:      Appearance: Normal appearance.  HENT:     Head: Normocephalic and atraumatic.     Right Ear: Tympanic membrane normal.     Left Ear: Tympanic membrane normal.     Nose:     Comments: Bilateral nares slightly erythematous with clear nasal drainage noted.  Pharynx normal.  Ears normal.  Eyes normal.    Mouth/Throat:     Pharynx: Oropharynx is clear.  Eyes:     Conjunctiva/sclera:  Conjunctivae normal.  Cardiovascular:     Rate and Rhythm: Normal rate and regular rhythm.     Heart sounds: Normal heart sounds. No murmur heard.   Pulmonary:     Effort: Pulmonary effort is normal.     Breath sounds: Normal breath sounds.     Comments: Lungs clear to auscultation Musculoskeletal:        General: Normal range of motion.     Cervical back: Normal range of motion and neck supple.  Skin:    General: Skin is warm and dry.  Neurological:     Mental Status: He is alert and oriented to person, place, and time.  Psychiatric:        Mood and Affect: Mood normal.        Behavior: Behavior normal.        Thought Content: Thought content normal.        Judgment: Judgment normal.     Diagnostics: FVC 1.96, FEV1 1.86.  Predicted FVC 4.26, predicted FEV1 3.31.  Spirometry indicates severe restriction.  Suspicious of malfunctioning spirometry equipment as patient is asymptomatic at today's visit.  Assessment and Plan: 1. Severe persistent asthma without complication   2. Other allergic rhinitis   3. Gastroesophageal reflux disease, unspecified whether esophagitis present   4. Seasonal allergic conjunctivitis     Meds ordered this encounter  Medications  .  budesonide-formoterol (SYMBICORT) 160-4.5 MCG/ACT inhaler    Sig: USE 2 INHALATIONS IN THE MORNING AND AT BEDTIME WITH SPACER AND RINSE MOUTH AFTERWARDS    Dispense:  30.6 g    Refill:  1  . montelukast (SINGULAIR) 10 MG tablet    Sig: Take 1 tablet (10 mg total) by mouth daily.    Dispense:  90 tablet    Refill:  1  . Tiotropium Bromide Monohydrate (SPIRIVA RESPIMAT) 1.25 MCG/ACT AERS    Sig: Inhale 2 puffs into the lungs daily.    Dispense:  12 g    Refill:  1  . albuterol (PROAIR HFA) 108 (90 Base) MCG/ACT inhaler    Sig: Inhale 2 puffs into the lungs every 4 (four) hours as needed for wheezing or shortness of breath.    Dispense:  1 each    Refill:  1    Patient Instructions  Asthma Continue montelukast  10 mg once a day to prevent cough or wheeze Continue Symbicort 160-2 puffs twice a day to prevent cough or wheeze Continue Spiriva 1.25 mcg - 2 puffs once a day to prevent cough or wheeze Continue albuterol 2 puffs every 4 hours as needed for cough or wheeze OR Instead use albuterol 0.083% solution via nebulizer one unit vial every 4 hours as needed for cough or wheeze Continue Fasenra 30 mg injections once every 8 weeks and have access to an epinephrine auto-injector  Allergic rhinitis Continue levocetirizine 5 mg once a day as needed for runny nose or itch Continue Flonase 2 sprays in each nostril once a day as needed for stuffy nose. In the right nostril, point the applicator out toward the right ear. In the left nostril, point the applicator out toward the left ear Consider saline nasal rinses as needed for nasal symptoms. Use this before any medicated nasal sprays for best result Continue azelastine 2 sprays in each nostril twice a day as needed for runny nose  Allergic conjunctivitis Some over the counter eye drops include Pataday one drop in each eye once a day as needed for red, itchy eyes OR Zaditor one drop in each eye twice a day as needed for red itchy eyes.  Reflux Continue dietary lifestyle modifications as listed below Continue omeprazole 20 mg twice a day to prevent reflux  Call the clinic if this treatment plan is not working well for you  Follow up in 2 months or sooner if needed.   Return in about 2 months (around 02/17/2021), or if symptoms worsen or fail to improve.    Thank you for the opportunity to care for this patient.  Please do not hesitate to contact me with questions.  Gareth Morgan, FNP [facility]

## 2020-12-19 ENCOUNTER — Telehealth: Payer: Self-pay

## 2020-12-19 NOTE — Addendum Note (Signed)
Addended by: Clovis Cao A on: 12/19/2020 01:10 PM   Modules accepted: Orders

## 2020-12-19 NOTE — Telephone Encounter (Signed)
Webb Silversmith did a spiro on a patient and asked for a no charge for it. Encounter was 12/18/2020

## 2021-01-25 ENCOUNTER — Other Ambulatory Visit: Payer: Self-pay | Admitting: Allergy

## 2021-02-04 ENCOUNTER — Ambulatory Visit (INDEPENDENT_AMBULATORY_CARE_PROVIDER_SITE_OTHER): Payer: Commercial Managed Care - PPO

## 2021-02-04 DIAGNOSIS — J4550 Severe persistent asthma, uncomplicated: Secondary | ICD-10-CM

## 2021-02-08 ENCOUNTER — Other Ambulatory Visit: Payer: Self-pay | Admitting: Family Medicine

## 2021-02-11 ENCOUNTER — Other Ambulatory Visit: Payer: Self-pay | Admitting: *Deleted

## 2021-02-11 MED ORDER — ALBUTEROL SULFATE HFA 108 (90 BASE) MCG/ACT IN AERS: 2.0000 | INHALATION_SPRAY | RESPIRATORY_TRACT | 1 refills | Status: DC | PRN

## 2021-02-19 ENCOUNTER — Encounter: Payer: Self-pay | Admitting: Family Medicine

## 2021-02-19 ENCOUNTER — Other Ambulatory Visit: Payer: Self-pay

## 2021-02-19 ENCOUNTER — Ambulatory Visit (INDEPENDENT_AMBULATORY_CARE_PROVIDER_SITE_OTHER): Payer: Commercial Managed Care - PPO | Admitting: Family Medicine

## 2021-02-19 VITALS — BP 118/76 | HR 88 | Temp 98.4°F | Resp 18 | Ht 67.0 in | Wt 157.6 lb

## 2021-02-19 DIAGNOSIS — K219 Gastro-esophageal reflux disease without esophagitis: Secondary | ICD-10-CM

## 2021-02-19 DIAGNOSIS — J4550 Severe persistent asthma, uncomplicated: Secondary | ICD-10-CM | POA: Diagnosis not present

## 2021-02-19 DIAGNOSIS — H1013 Acute atopic conjunctivitis, bilateral: Secondary | ICD-10-CM

## 2021-02-19 DIAGNOSIS — J3089 Other allergic rhinitis: Secondary | ICD-10-CM

## 2021-02-19 DIAGNOSIS — H1010 Acute atopic conjunctivitis, unspecified eye: Secondary | ICD-10-CM

## 2021-02-19 MED ORDER — MONTELUKAST SODIUM 10 MG PO TABS: 10.0000 mg | ORAL_TABLET | Freq: Every day | ORAL | 1 refills | Status: DC

## 2021-02-19 MED ORDER — ALBUTEROL SULFATE HFA 108 (90 BASE) MCG/ACT IN AERS: 2.0000 | INHALATION_SPRAY | RESPIRATORY_TRACT | 1 refills | Status: DC | PRN

## 2021-02-19 MED ORDER — SPIRIVA RESPIMAT 1.25 MCG/ACT IN AERS: 2.0000 | INHALATION_SPRAY | Freq: Every day | RESPIRATORY_TRACT | 5 refills | Status: DC

## 2021-02-19 MED ORDER — OMEPRAZOLE 20 MG PO CPDR: 20.0000 mg | DELAYED_RELEASE_CAPSULE | Freq: Two times a day (BID) | ORAL | 1 refills | Status: DC

## 2021-02-19 MED ORDER — LEVOCETIRIZINE DIHYDROCHLORIDE 5 MG PO TABS: 5.0000 mg | ORAL_TABLET | Freq: Every evening | ORAL | 1 refills | Status: DC

## 2021-02-19 MED ORDER — BUDESONIDE-FORMOTEROL FUMARATE 160-4.5 MCG/ACT IN AERO: INHALATION_SPRAY | RESPIRATORY_TRACT | 5 refills | Status: DC

## 2021-02-19 NOTE — Progress Notes (Signed)
[place] [address] Dept: [phone]  FOLLOW UP NOTE  Patient ID: Preston Taylor, male    DOB: August 31, 1965  Age: 55 y.o. MRN: [id number] Date of Office Visit: 02/19/2021  Assessment  Chief Complaint: Asthma (ACT -25 Asthma has been good no symptoms or flares )  HPI Fritz Cauthon is a 55 year old male who presents the clinic for follow-up visit.  His last visit was 12/2020 where he reported that his asthma had been well controlled.  During that visit the spirometry results were significantly worse than at his previous visits and I suspected our equipment was faulty at that time.  He is here today to repeat spirometry.  He reports his asthma remains well controlled at this time.   Drug Allergies:  Allergies  Allergen Reactions   Lisinopril Nausea And Vomiting    Physical Exam: BP 118/76   Pulse 88   Temp 98.4 F (36.9 C)   Resp 18   Ht 5\' 7"  (1.702 m)   Wt 157 lb 9.6 oz (71.5 kg)   SpO2 97%   BMI 24.68 kg/m     Diagnostics: FVC 3.40, FEV1 3.05.  Predicted FVC 4.19, predicted FEV1 3.32.  Spirometry indicates normal ventilatory function.  Assessment and Plan: 1. Severe persistent asthma without complication   2. Seasonal allergic conjunctivitis   3. Other allergic rhinitis   4. Gastroesophageal reflux disease, unspecified whether esophagitis present     Meds ordered this encounter  Medications   omeprazole (PRILOSEC) 20 MG capsule    Sig: Take 1 capsule (20 mg total) by mouth 2 (two) times daily.    Dispense:  180 capsule    Refill:  1   budesonide-formoterol (SYMBICORT) 160-4.5 MCG/ACT inhaler    Sig: USE 2 INHALATIONS IN THE MORNING AND AT BEDTIME WITH SPACER AND RINSE MOUTH AFTERWARDS    Dispense:  30.6 g    Refill:  5   montelukast (SINGULAIR) 10 MG tablet    Sig: Take 1 tablet (10 mg total) by mouth daily.    Dispense:  90 tablet    Refill:  1   albuterol (PROAIR HFA) 108 (90 Base) MCG/ACT inhaler    Sig: Inhale 2 puffs into the lungs every 4  (four) hours as needed for wheezing or shortness of breath.    Dispense:  18 g    Refill:  1   Tiotropium Bromide Monohydrate (SPIRIVA RESPIMAT) 1.25 MCG/ACT AERS    Sig: Inhale 2 puffs into the lungs daily. Inhale 2 puffs once a day to prevent cough or wheeze    Dispense:  12 g    Refill:  5   levocetirizine (XYZAL) 5 MG tablet    Sig: Take 1 tablet (5 mg total) by mouth every evening.    Dispense:  90 tablet    Refill:  1    Patient Instructions  Asthma Continue montelukast 10 mg once a day to prevent cough or wheeze Continue Symbicort 160-2 puffs twice a day to prevent cough or wheeze Continue Spiriva 1.25 mcg - 2 puffs once a day to prevent cough or wheeze Continue albuterol 2 puffs every 4 hours as needed for cough or wheeze OR Instead use albuterol 0.083% solution via nebulizer one unit vial every 4 hours as needed for cough or wheeze Continue Fasenra 30 mg injections once every 8 weeks and have access to an epinephrine auto-injector  Allergic rhinitis Continue levocetirizine 5 mg once a day as needed for runny nose or itch Continue  Flonase 2 sprays in each nostril once a day as needed for stuffy nose. In the right nostril, point the applicator out toward the right ear. In the left nostril, point the applicator out toward the left ear Consider saline nasal rinses as needed for nasal symptoms. Use this before any medicated nasal sprays for best result Continue azelastine 2 sprays in each nostril twice a day as needed for runny nose  Allergic conjunctivitis Some over the counter eye drops include Pataday one drop in each eye once a day as needed for red, itchy eyes OR Zaditor one drop in each eye twice a day as needed for red itchy eyes.  Reflux Continue dietary lifestyle modifications as listed below Continue omeprazole 20 mg twice a day to prevent reflux  Call the clinic if this treatment plan is not working well for you  Follow up in 4 months or sooner if  needed.   Return in about 4 months (around 06/22/2021), or if symptoms worsen or fail to improve.    Thank you for the opportunity to care for this patient.  Please do not hesitate to contact me with questions.  Gareth Morgan, FNP [facility]

## 2021-02-20 ENCOUNTER — Encounter: Payer: Self-pay | Admitting: Family Medicine

## 2021-02-20 ENCOUNTER — Other Ambulatory Visit: Payer: Self-pay | Admitting: *Deleted

## 2021-02-20 NOTE — Patient Instructions (Signed)
Asthma Continue montelukast 10 mg once a day to prevent cough or wheeze Continue Symbicort 160-2 puffs twice a day to prevent cough or wheeze Continue Spiriva 1.25 mcg - 2 puffs once a day to prevent cough or wheeze Continue albuterol 2 puffs every 4 hours as needed for cough or wheeze OR Instead use albuterol 0.083% solution via nebulizer one unit vial every 4 hours as needed for cough or wheeze Continue Fasenra 30 mg injections once every 8 weeks and have access to an epinephrine auto-injector  Allergic rhinitis Continue levocetirizine 5 mg once a day as needed for runny nose or itch Continue Flonase 2 sprays in each nostril once a day as needed for stuffy nose. In the right nostril, point the applicator out toward the right ear. In the left nostril, point the applicator out toward the left ear Consider saline nasal rinses as needed for nasal symptoms. Use this before any medicated nasal sprays for best result Continue azelastine 2 sprays in each nostril twice a day as needed for runny nose  Allergic conjunctivitis Some over the counter eye drops include Pataday one drop in each eye once a day as needed for red, itchy eyes OR Zaditor one drop in each eye twice a day as needed for red itchy eyes.  Reflux Continue dietary lifestyle modifications as listed below Continue omeprazole 20 mg twice a day to prevent reflux  Call the clinic if this treatment plan is not working well for you  Follow up in 4 months or sooner if needed.   Lifestyle Changes for Controlling GERD When you have GERD, stomach acid feels as if it's backing up toward your mouth. Whether or not you take medication to control your GERD, your symptoms can often be improved with lifestyle changes.   Raise Your Head Reflux is more likely to strike when you're lying down flat, because stomach fluid can flow backward more easily. Raising the head of your bed 4-6 inches can help. To do this: Slide blocks or books under the  legs at the head of your bed. Or, place a wedge under the mattress. Many foam stores can make a suitable wedge for you. The wedge should run from your waist to the top of your head. Don't just prop your head on several pillows. This increases pressure on your stomach. It can make GERD worse.  Watch Your Eating Habits Certain foods may increase the acid in your stomach or relax the lower esophageal sphincter, making GERD more likely. It's best to avoid the following: Coffee, tea, and carbonated drinks (with and without caffeine) Fatty, fried, or spicy food Mint, chocolate, onions, and tomatoes Any other foods that seem to irritate your stomach or cause you pain  Relieve the Pressure Eat smaller meals, even if you have to eat more often. Don't lie down right after you eat. Wait a few hours for your stomach to empty. Avoid tight belts and tight-fitting clothes. Lose excess weight.  Tobacco and Alcohol Avoid smoking tobacco and drinking alcohol. They can make GERD symptoms worse.

## 2021-02-25 ENCOUNTER — Telehealth: Payer: Self-pay | Admitting: *Deleted

## 2021-02-25 NOTE — Telephone Encounter (Signed)
PA has been submitted through CoverMyMeds for Levocetirizine and is currently pending approval/denial.  

## 2021-04-01 ENCOUNTER — Other Ambulatory Visit: Payer: Self-pay

## 2021-04-01 ENCOUNTER — Ambulatory Visit (INDEPENDENT_AMBULATORY_CARE_PROVIDER_SITE_OTHER): Payer: Commercial Managed Care - PPO

## 2021-04-01 DIAGNOSIS — J4550 Severe persistent asthma, uncomplicated: Secondary | ICD-10-CM

## 2021-05-17 ENCOUNTER — Other Ambulatory Visit: Payer: Self-pay | Admitting: Allergy

## 2021-05-27 ENCOUNTER — Ambulatory Visit (INDEPENDENT_AMBULATORY_CARE_PROVIDER_SITE_OTHER): Payer: Commercial Managed Care - PPO

## 2021-05-27 ENCOUNTER — Other Ambulatory Visit: Payer: Self-pay

## 2021-05-27 DIAGNOSIS — J4550 Severe persistent asthma, uncomplicated: Secondary | ICD-10-CM | POA: Diagnosis not present

## 2021-06-22 ENCOUNTER — Ambulatory Visit (INDEPENDENT_AMBULATORY_CARE_PROVIDER_SITE_OTHER): Payer: Commercial Managed Care - PPO | Admitting: Allergy

## 2021-06-22 ENCOUNTER — Other Ambulatory Visit: Payer: Self-pay

## 2021-06-22 ENCOUNTER — Encounter: Payer: Self-pay | Admitting: Allergy

## 2021-06-22 VITALS — BP 122/74 | HR 83 | Temp 98.7°F | Resp 16

## 2021-06-22 DIAGNOSIS — J4550 Severe persistent asthma, uncomplicated: Secondary | ICD-10-CM

## 2021-06-22 DIAGNOSIS — H1013 Acute atopic conjunctivitis, bilateral: Secondary | ICD-10-CM | POA: Insufficient documentation

## 2021-06-22 DIAGNOSIS — J3089 Other allergic rhinitis: Secondary | ICD-10-CM

## 2021-06-22 DIAGNOSIS — K219 Gastro-esophageal reflux disease without esophagitis: Secondary | ICD-10-CM | POA: Diagnosis not present

## 2021-06-22 MED ORDER — BUDESONIDE-FORMOTEROL FUMARATE 160-4.5 MCG/ACT IN AERO: INHALATION_SPRAY | RESPIRATORY_TRACT | 2 refills | Status: DC

## 2021-06-22 MED ORDER — MONTELUKAST SODIUM 10 MG PO TABS: 10.0000 mg | ORAL_TABLET | Freq: Every day | ORAL | 2 refills | Status: DC

## 2021-06-22 MED ORDER — AZELASTINE HCL 0.1 % NA SOLN: 1.0000 | Freq: Two times a day (BID) | NASAL | 2 refills | Status: DC | PRN

## 2021-06-22 MED ORDER — SPIRIVA RESPIMAT 1.25 MCG/ACT IN AERS: 2.0000 | INHALATION_SPRAY | Freq: Every day | RESPIRATORY_TRACT | 2 refills | Status: DC

## 2021-06-22 MED ORDER — ALBUTEROL SULFATE HFA 108 (90 BASE) MCG/ACT IN AERS: 2.0000 | INHALATION_SPRAY | RESPIRATORY_TRACT | 1 refills | Status: DC | PRN

## 2021-06-22 MED ORDER — LEVOCETIRIZINE DIHYDROCHLORIDE 5 MG PO TABS: 5.0000 mg | ORAL_TABLET | Freq: Every evening | ORAL | 2 refills | Status: DC

## 2021-06-22 MED ORDER — FLUTICASONE PROPIONATE 50 MCG/ACT NA SUSP: 1.0000 | Freq: Two times a day (BID) | NASAL | 2 refills | Status: DC | PRN

## 2021-06-22 NOTE — Patient Instructions (Addendum)
Asthma: Use albuterol 2 puffs every 6 hours as needed for the next few days and see if if helps with your chest tightness.  Daily controller medication(s): Symbicort 164mcg 2 puffs twice a day with spacer and rinse mouth afterwards. Continue Spiriva 2 puffs once a day. Continue Singulair (montelukast) 10mg  daily at night. Continue Fasenra injections every 8 weeks.  May use albuterol rescue inhaler 2 puffs every 4 to 6 hours as needed for shortness of breath, chest tightness, coughing, and wheezing. May use albuterol rescue inhaler 2 puffs 5 to 15 minutes prior to strenuous physical activities. Monitor frequency of use.  Asthma control goals:  Full participation in all desired activities (may need albuterol before activity) Albuterol use two times or less a week on average (not counting use with activity) Cough interfering with sleep two times or less a month Oral steroids no more than once a year No hospitalizations   Allergic rhino conjunctivitis Use over the counter refresh eye drops to see if it helps with the eye dryness. Get your eyes checked.  Continue Singulair (montelukast) 10mg  daily at night. Continue levocetirizine 5 mg once a day. Use Flonase (fluticasone) nasal spray 1 spray per nostril twice a day as needed for nasal congestion.  Use azelastine nasal spray 1-2 sprays per nostril twice a day as needed for runny nose/drainage. Nasal saline spray (i.e., Simply Saline) or nasal saline lavage (i.e., NeilMed) is recommended as needed and prior to medicated nasal sprays.  Heartburn: Continue lifestyle and dietary modifications. Continue omeprazole 20mg  twice a day.  Follow up in 4 months or sooner if needed.

## 2021-06-22 NOTE — Progress Notes (Signed)
Follow Up Note  RE: Caius Silbernagel MRN: [id number] DOB: 1966/02/16 Date of Office Visit: 06/22/2021  Referring provider: [facility] [place] Primary care provider: [facility] [place]  Chief Complaint: Asthma  History of Present Illness: I had the pleasure of seeing Leelan Rajewski for a follow up visit at the [facility] on 06/22/2021. He is a 55 y.o. male, who is being followed for asthma on Fasenra, allergic rhino conjunctivitis, reflux. His previous allergy office visit was on 02/19/2021 with Gareth Morgan, [place]. Today is a regular follow up visit.  Asthma Noted some chest tightness the past 2 days but otherwise he has been doing very well since started on Fasenra and has not required to go to the ER/UC or had prednisone.  Currently on Symbicort 156mcg 2 puffs twice a day and Spiriva 2 puffs once a day.  Denies any SOB, coughing, wheezing, nocturnal awakenings, ER/urgent care visits or prednisone use since the last visit.  Allergic rhino conjunctivitis Taking Singulair and Xyzal daily, Flonase and azelastine 1 spray BID. No nosebleeds.  Using eye drops daily for itchy/dry eyes. No recent eye exam.  Reflux Tried wean off omeprazole but had reflux symptoms. Currently on omeprazole 20mg  BID.  Assessment and Plan: Zamere is a 55 y.o. male with: Severe persistent asthma without complication Doing well with below regimen. Berna Bue is helping. Some chest tightness x 2 days but did not use albuterol.   Today's spirometry was normal. Use albuterol 2 puffs every 6 hours as needed for the next few days and see if helps with your chest tightness.  Daily controller medication(s): Symbicort 171mcg 2 puffs twice a day with spacer and rinse mouth afterwards. Continue Spiriva 2 puffs once a day. Continue Singulair (montelukast) 10mg  daily at night. Continue Fasenra injections every 8 weeks.  May use albuterol rescue inhaler 2 puffs every 4 to 6 hours as needed for shortness of breath, chest  tightness, coughing, and wheezing. May use albuterol rescue inhaler 2 puffs 5 to 15 minutes prior to strenuous physical activities. Monitor frequency of use.  Get spirometry at next visit.  Other allergic rhinitis Past history - 2014 spt positive to molds.  Interim history - itchy/dry eyes. No recent eye exam. Use over the counter refresh eye drops to see if it helps with the eye dryness. Get your eyes checked. Continue Singulair (montelukast) 10mg  daily at night. Continue levocetirizine 5 mg once a day. Use Flonase (fluticasone) nasal spray 1 spray per nostril twice a day as needed for nasal congestion.  Use azelastine nasal spray 1-2 sprays per nostril twice a day as needed for runny nose/drainage. Nasal saline spray (i.e., Simply Saline) or nasal saline lavage (i.e., NeilMed) is recommended as needed and prior to medicated nasal sprays.  Allergic conjunctivitis of both eyes See assessment and plan as above.  Gastroesophageal reflux disease Controlled on PPI. Unable to wean off. Continue lifestyle and dietary modifications. Continue omeprazole 20mg  twice a day.  Return in about 4 months (around 10/20/2021).  Meds ordered this encounter  Medications   budesonide-formoterol (SYMBICORT) 160-4.5 MCG/ACT inhaler    Sig: USE 2 INHALATIONS IN THE MORNING AND AT BEDTIME WITH SPACER AND RINSE MOUTH AFTERWARDS    Dispense:  30.6 g    Refill:  2   montelukast (SINGULAIR) 10 MG tablet    Sig: Take 1 tablet (10 mg total) by mouth daily.    Dispense:  90 tablet    Refill:  2   Tiotropium Bromide Monohydrate (  SPIRIVA RESPIMAT) 1.25 MCG/ACT AERS    Sig: Inhale 2 puffs into the lungs daily. Inhale 2 puffs once a day to prevent cough or wheeze    Dispense:  12 g    Refill:  2   albuterol (PROAIR HFA) 108 (90 Base) MCG/ACT inhaler    Sig: Inhale 2 puffs into the lungs every 4 (four) hours as needed for wheezing or shortness of breath.    Dispense:  18 g    Refill:  1   levocetirizine (XYZAL) 5  MG tablet    Sig: Take 1 tablet (5 mg total) by mouth every evening.    Dispense:  90 tablet    Refill:  2   azelastine (ASTELIN) 0.1 % nasal spray    Sig: Place 1-2 sprays into both nostrils 2 (two) times daily as needed (nasal drainage). Use in each nostril as directed    Dispense:  90 mL    Refill:  2   fluticasone (FLONASE) 50 MCG/ACT nasal spray    Sig: Place 1 spray into both nostrils 2 (two) times daily as needed (nasal congestion).    Dispense:  48 g    Refill:  2    Lab Orders  No laboratory test(s) ordered today    Diagnostics: Spirometry:  Tracings reviewed. His effort: Good reproducible efforts. FVC: 3.28L FEV1: 2.95L, 98% predicted FEV1/FVC ratio: 90% Interpretation: Spirometry consistent with normal pattern.  Please see scanned spirometry results for details.  Medication List:  Current Outpatient Medications  Medication Sig Dispense Refill   albuterol (PROVENTIL) (2.5 MG/3ML) 0.083% nebulizer solution Use one vial in the nebulizer every 4-6 hours if needed for cough or wheeze 300 mL 0   amLODipine (NORVASC) 10 MG tablet      azelastine (ASTELIN) 0.1 % nasal spray Place 1-2 sprays into both nostrils 2 (two) times daily as needed (nasal drainage). Use in each nostril as directed 90 mL 2   etodolac (LODINE XL) 400 MG 24 hr tablet Take 400 mg by mouth daily.     FASENRA 30 MG/ML SOSY INJECT 30 MG (1 SYRINGE) UNDER THE SKIN EVERY 8 WEEKS 1 mL 5   fluticasone (FLONASE) 50 MCG/ACT nasal spray Place 1 spray into both nostrils 2 (two) times daily as needed (nasal congestion). 48 g 2   ipratropium-albuterol (DUONEB) 0.5-2.5 (3) MG/3ML SOLN USE 1 VIAL VIA NEBULIZER EVERY 4 TO 6 HOURS IF NEEDED FOR COUGH OR WHEEZE 300 mL 10   losartan (COZAAR) 25 MG tablet      omeprazole (PRILOSEC) 20 MG capsule Take 1 capsule (20 mg total) by mouth 2 (two) times daily. 180 capsule 1   simvastatin (ZOCOR) 10 MG tablet      triamcinolone cream (KENALOG) 0.1 % APP EXT AA BID     albuterol  (PROAIR HFA) 108 (90 Base) MCG/ACT inhaler Inhale 2 puffs into the lungs every 4 (four) hours as needed for wheezing or shortness of breath. 18 g 1   budesonide-formoterol (SYMBICORT) 160-4.5 MCG/ACT inhaler USE 2 INHALATIONS IN THE MORNING AND AT BEDTIME WITH SPACER AND RINSE MOUTH AFTERWARDS 30.6 g 2   hydrOXYzine (ATARAX/VISTARIL) 10 MG tablet TK 1 TO 2 TS PO TID PRF ITCHING (Patient not taking: Reported on 06/22/2021)     levocetirizine (XYZAL) 5 MG tablet Take 1 tablet (5 mg total) by mouth every evening. 90 tablet 2   montelukast (SINGULAIR) 10 MG tablet Take 1 tablet (10 mg total) by mouth daily. 90 tablet 2   Tiotropium Bromide  Monohydrate (SPIRIVA RESPIMAT) 1.25 MCG/ACT AERS Inhale 2 puffs into the lungs daily. Inhale 2 puffs once a day to prevent cough or wheeze 12 g 2   Current Facility-Administered Medications  Medication Dose Route Frequency Provider Last Rate Last Admin   Benralizumab SOSY 30 mg  30 mg Subcutaneous Q8 Weeks Kozlow, Donnamarie Poag, MD   30 mg at 05/27/21 1441   Allergies: Allergies  Allergen Reactions   Lisinopril Nausea And Vomiting   I reviewed his past medical history, social history, family history, and environmental history and no significant changes have been reported from his previous visit.  Review of Systems  Constitutional:  Negative for appetite change, chills, fever and unexpected weight change.  HENT:  Negative for congestion and rhinorrhea.   Eyes:  Positive for itching.  Respiratory:  Positive for chest tightness. Negative for cough, shortness of breath and wheezing.   Gastrointestinal:  Negative for abdominal pain.  Skin:  Negative for rash.  Neurological:  Negative for headaches.   Objective: BP 122/74   Pulse 83   Temp 98.7 F (37.1 C) (Temporal)   Resp 16   SpO2 96%  There is no height or weight on file to calculate BMI. Physical Exam Vitals and nursing note reviewed.  Constitutional:      Appearance: Normal appearance. He is  well-developed.  HENT:     Head: Normocephalic and atraumatic.     Right Ear: Tympanic membrane and external ear normal.     Left Ear: Tympanic membrane and external ear normal.     Nose: Nose normal.     Mouth/Throat:     Mouth: Mucous membranes are moist.     Pharynx: Oropharynx is clear.  Eyes:     Conjunctiva/sclera: Conjunctivae normal.  Cardiovascular:     Rate and Rhythm: Normal rate and regular rhythm.     Heart sounds: Normal heart sounds. No murmur heard. Pulmonary:     Effort: Pulmonary effort is normal.     Breath sounds: Normal breath sounds. No wheezing, rhonchi or rales.  Musculoskeletal:     Cervical back: Neck supple.  Skin:    General: Skin is warm.     Findings: No rash.  Neurological:     Mental Status: He is alert and oriented to person, place, and time.  Psychiatric:        Behavior: Behavior normal.   Previous notes and tests were reviewed. The plan was reviewed with the patient/family, and all questions/concerned were addressed.  It was my pleasure to see Preston Taylor today and participate in his care. Please feel free to contact me with any questions or concerns.  Sincerely,  Rexene Alberts, DO Allergy & Immunology  [facility] office: [place] office: [phone]

## 2021-06-22 NOTE — Assessment & Plan Note (Signed)
Controlled on PPI. Unable to wean off.  Continue lifestyle and dietary modifications.  Continue omeprazole 20mg  twice a day.

## 2021-06-22 NOTE — Assessment & Plan Note (Signed)
Doing well with below regimen. Berna Bue is helping. Some chest tightness x 2 days but did not use albuterol.    Today's spirometry was normal. . Use albuterol 2 puffs every 6 hours as needed for the next few days and see if helps with your chest tightness.  . Daily controller medication(s): Symbicort 152mcg 2 puffs twice a day with spacer and rinse mouth afterwards. . Continue Spiriva 2 puffs once a day. . Continue Singulair (montelukast) 10mg  daily at night. . Continue Fasenra injections every 8 weeks.  . May use albuterol rescue inhaler 2 puffs every 4 to 6 hours as needed for shortness of breath, chest tightness, coughing, and wheezing. May use albuterol rescue inhaler 2 puffs 5 to 15 minutes prior to strenuous physical activities. Monitor frequency of use.   Get spirometry at next visit.

## 2021-06-22 NOTE — Assessment & Plan Note (Signed)
.   See assessment and plan as above. 

## 2021-06-22 NOTE — Assessment & Plan Note (Signed)
Past history - 2014 spt positive to molds.  Interim history - itchy/dry eyes. No recent eye exam. . Use over the counter refresh eye drops to see if it helps with the eye dryness. . Get your eyes checked. . Continue Singulair (montelukast) 10mg  daily at night. . Continue levocetirizine 5 mg once a day. . Use Flonase (fluticasone) nasal spray 1 spray per nostril twice a day as needed for nasal congestion.  . Use azelastine nasal spray 1-2 sprays per nostril twice a day as needed for runny nose/drainage. . Nasal saline spray (i.e., Simply Saline) or nasal saline lavage (i.e., NeilMed) is recommended as needed and prior to medicated nasal sprays.

## 2021-06-24 ENCOUNTER — Telehealth: Payer: Self-pay | Admitting: Allergy

## 2021-06-24 ENCOUNTER — Other Ambulatory Visit: Payer: Self-pay | Admitting: *Deleted

## 2021-06-24 MED ORDER — CETIRIZINE HCL 10 MG PO TABS: 10.0000 mg | ORAL_TABLET | Freq: Every day | ORAL | 2 refills | Status: DC

## 2021-06-24 NOTE — Telephone Encounter (Signed)
Patient's levocetirizne is not covered by his plan. Pharmacy does have two alternatives: Loratadine OTC 10mg  or Cetirizine OTC 10mg  or 5mg .   Representative states they will call back tomorrow to follow up.

## 2021-06-24 NOTE — Telephone Encounter (Signed)
Sent in new rx for zyrtec 10mg  daily.

## 2021-06-24 NOTE — Telephone Encounter (Signed)
Which medication would you prefer for the patient to switch to?

## 2021-07-22 ENCOUNTER — Other Ambulatory Visit: Payer: Self-pay

## 2021-07-22 ENCOUNTER — Ambulatory Visit (INDEPENDENT_AMBULATORY_CARE_PROVIDER_SITE_OTHER): Payer: Commercial Managed Care - PPO

## 2021-07-22 DIAGNOSIS — J4550 Severe persistent asthma, uncomplicated: Secondary | ICD-10-CM | POA: Diagnosis not present

## 2021-08-30 ENCOUNTER — Other Ambulatory Visit: Payer: Self-pay | Admitting: Allergy and Immunology

## 2021-09-08 ENCOUNTER — Other Ambulatory Visit: Payer: Self-pay | Admitting: *Deleted

## 2021-09-08 MED ORDER — IPRATROPIUM-ALBUTEROL 0.5-2.5 (3) MG/3ML IN SOLN: RESPIRATORY_TRACT | 1 refills | Status: DC

## 2021-09-16 ENCOUNTER — Ambulatory Visit (INDEPENDENT_AMBULATORY_CARE_PROVIDER_SITE_OTHER): Payer: Commercial Managed Care - PPO | Admitting: *Deleted

## 2021-09-16 ENCOUNTER — Other Ambulatory Visit: Payer: Self-pay

## 2021-09-16 DIAGNOSIS — J4550 Severe persistent asthma, uncomplicated: Secondary | ICD-10-CM

## 2021-10-21 ENCOUNTER — Ambulatory Visit: Payer: Commercial Managed Care - PPO | Admitting: Allergy

## 2021-10-26 ENCOUNTER — Ambulatory Visit: Payer: Commercial Managed Care - PPO | Admitting: Family

## 2021-11-09 NOTE — Progress Notes (Signed)
? ?FOLLOW UP ?Date of Service/Encounter:  11/11/21 ? ? ?Subjective:  ?Preston Taylor (DOB: 10-Sep-1965) is a 56 y.o. male who returns to the [facility] on 11/11/2021 in re-evaluation of the following:  asthma on Fasenra, allergic rhino conjunctivitis, reflux ?History obtained from: chart review and patient. ? ?For Review, LV was on 06/22/21  with Dr. Maudie Mercury.  FEV1 98% at last visit. He was controlled on current regimen at that visit. ? ?Pertinent History/Diagnostics:  ?- Asthma: Berna Bue is helping, also taking Symbicort, spiriva, singulair ?- Allergic Rhinitis: itchy/dry eyes. No recent eye exam ? - SPT environmental panel -2014 positive to molds ?- GERD: controlled on PPI (omeprazole 20 mg BID), unable to wean off  ?   ?Today presents for follow-up. ?He is noticing that the 2 weeks prior to fasenra injections, he is needing his albuterol nebulizer before bed.  He is fine before those 2 weeks. He is using his symbicort and spiriva every day.   ?He does not want to make any change as this is the most control he has ever been with his asthma.  We did discuss switching to an anti-IL-5 that is every 4 weeks, but he prefers sticking with what he knows works. ?He is still using singulair, xyzal, flonase and azelastine every day and with this his allergy symptoms are well-controlled. ?Omeprazole he is taking 20 mg twice daily and this does control his reflux.  He tried to wean himself off of it but his reflux returned.  As long as he takes it, symptoms are controlled ? ? ?Allergies as of 11/11/2021   ? ?   Reactions  ? Lisinopril Nausea And Vomiting  ? ?  ? ?  ?Medication List  ?  ? ?  ? Accurate as of November 11, 2021  4:34 PM. If you have any questions, ask your nurse or doctor.  ?  ?  ? ?  ? ?STOP taking these medications   ? ?etodolac 400 MG 24 hr tablet ?Commonly known as: LODINE XL ?Stopped by: Sigurd Sos, MD ?  ?hydrOXYzine 10 MG tablet ?Commonly known as: ATARAX ?Stopped by: Sigurd Sos, MD ?  ?triamcinolone cream  0.1 % ?Commonly known as: KENALOG ?Stopped by: Sigurd Sos, MD ?  ? ?  ? ?TAKE these medications   ? ?albuterol (2.5 MG/3ML) 0.083% nebulizer solution ?Commonly known as: PROVENTIL ?Use one vial in the nebulizer every 4-6 hours if needed for cough or wheeze ?  ?albuterol 108 (90 Base) MCG/ACT inhaler ?Commonly known as: ProAir HFA ?Inhale 2 puffs into the lungs every 4 (four) hours as needed for wheezing or shortness of breath. ?  ?amLODipine 10 MG tablet ?Commonly known as: NORVASC ?  ?azelastine 0.1 % nasal spray ?Commonly known as: ASTELIN ?Place 1-2 sprays into both nostrils 2 (two) times daily as needed (nasal drainage). Use in each nostril as directed ?  ?budesonide-formoterol 160-4.5 MCG/ACT inhaler ?Commonly known as: Symbicort ?USE 2 INHALATIONS IN THE MORNING AND AT BEDTIME WITH SPACER AND RINSE MOUTH AFTERWARDS ?  ?cetirizine 10 MG tablet ?Commonly known as: ZyrTEC Allergy ?Take 1 tablet (10 mg total) by mouth daily. ?  ?Fasenra 30 MG/ML Sosy ?Generic drug: Benralizumab ?INJECT 1 SYRINGE (30 MG) UNDER THE SKIN EVERY 8 WEEKS ?  ?fluticasone 50 MCG/ACT nasal spray ?Commonly known as: Flonase ?Place 1 spray into both nostrils 2 (two) times daily as needed (nasal congestion). ?  ?ipratropium-albuterol 0.5-2.5 (3) MG/3ML Soln ?Commonly known as: DUONEB ?Use one vial in the nebulizer  every 4-6 hours if needed for cough or wheeze. ?  ?losartan 25 MG tablet ?Commonly known as: COZAAR ?  ?montelukast 10 MG tablet ?Commonly known as: SINGULAIR ?Take 1 tablet (10 mg total) by mouth daily. ?  ?omeprazole 20 MG capsule ?Commonly known as: PRILOSEC ?Take 1 capsule (20 mg total) by mouth 2 (two) times daily. ?  ?simvastatin 10 MG tablet ?Commonly known as: ZOCOR ?  ?Spiriva Respimat 1.25 MCG/ACT Aers ?Generic drug: Tiotropium Bromide Monohydrate ?Inhale 2 puffs into the lungs daily. Inhale 2 puffs once a day to prevent cough or wheeze ?  ? ?  ? ?Past Medical History:  ?Diagnosis Date  ? Allergic rhinitis   ? Asthma    ? ?History reviewed. No pertinent surgical history. ?Otherwise, there have been no changes to his past medical history, surgical history, family history, or social history. ? ?ROS: All others negative except as noted per HPI.  ? ?Objective:  ?BP 118/70   Pulse 74   Temp 98.3 ?F (36.8 ?C) (Temporal)   Resp 16   SpO2 97%  ?There is no height or weight on file to calculate BMI. ?Physical Exam: ?General Appearance:  Alert, cooperative, no distress, appears stated age  ?Head:  Normocephalic, without obvious abnormality, atraumatic  ?Eyes:  Conjunctiva clear, EOM's intact  ?Nose: Nares normal, hypertrophic turbinates, normal mucosa, no visible anterior polyps, and septum midline  ?Throat: Lips, tongue normal; teeth and gums normal, normal posterior oropharynx  ?Neck: Supple, symmetrical  ?Lungs:   clear to auscultation bilaterally, Respirations unlabored, no coughing  ?Heart:  regular rate and rhythm and no murmur, Appears well perfused  ?Extremities: No edema  ?Skin: Skin color, texture, turgor normal, no rashes or lesions on visualized portions of skin  ?Neurologic: No gross deficits  ?Spirometry:  ?Tracings reviewed. His effort: Good reproducible efforts. ?FVC: 2.98L ?FEV1:2.75 L, 80% predicted ?FEV1/FVC ratio: 119% ?Interpretation:  nonobstructive pattern .  ?Please see scanned spirometry results for details. ? ?Assessment/Plan  ?Overall asthma and allergic rhinitis and conjunctivitis are controlled with current regimen.  He is noticing increased asthma symptoms in the 2 weeks prior to his upcoming Fasenra injection.  However, he is not interested in making any changes to his current plan. ?He would be interested in allergy injections, and I discussed that he would need to undergo updated testing which would require him to stop antihistamines for 3 days prior to that testing.  His last test was in 2014 and was positive to mold only. ? ?Asthma:stable ?Use albuterol 2 puffs every 6 hours as needed for the next few  days and see if if helps with your chest tightness.  ?Daily controller medication(s): Symbicort 167mg 2 puffs twice a day with spacer and rinse mouth afterwards. ?Continue Spiriva 2 puffs once a day. ?Continue Singulair (montelukast) '10mg'$  daily at night. ?Continue Fasenra injections every 8 weeks. Continue to monitor your symptoms prior to these injections. ?May use albuterol rescue inhaler 2 puffs every 4 to 6 hours as needed for shortness of breath, chest tightness, coughing, and wheezing. May use albuterol rescue inhaler 2 puffs 5 to 15 minutes prior to strenuous physical activities. Monitor frequency of use.  ?Asthma control goals:  ?Full participation in all desired activities (may need albuterol before activity) ?Albuterol use two times or less a week on average (not counting use with activity) ?Cough interfering with sleep two times or less a month ?Oral steroids no more than once a year ?No hospitalizations  ? ?Allergic rhino conjunctivitis-controlled ?Use over  the counter refresh eye drops to see if it helps with the eye dryness. ?Get your eyes checked. ? ?Continue Singulair (montelukast) '10mg'$  daily at night. ?Continue levocetirizine 5 mg once a day. ?Use Flonase (fluticasone) nasal spray 1 spray per nostril twice a day as needed for nasal congestion.  ?Use azelastine nasal spray 1-2 sprays per nostril twice a day as needed for runny nose/drainage. ?Nasal saline spray (i.e., Simply Saline) or nasal saline lavage (i.e., NeilMed) is recommended as needed and prior to medicated nasal sprays. ? ?Heartburn:controlled if taking medication every day ?Continue lifestyle and dietary modifications. ?Continue omeprazole '20mg'$  twice a day. ? ?Follow up in 4 months or sooner if needed.  ?If interested in allergy injections, come back for updated allergy testing.  Must stop your levo cetirizine and azelastine nasal spray 3 days prior to this appointment ? ?Sigurd Sos, MD  ?[facility] ? ? ? ? ? ? ?

## 2021-11-11 ENCOUNTER — Encounter: Payer: Self-pay | Admitting: Internal Medicine

## 2021-11-11 ENCOUNTER — Ambulatory Visit (INDEPENDENT_AMBULATORY_CARE_PROVIDER_SITE_OTHER): Payer: BC Managed Care – PPO

## 2021-11-11 ENCOUNTER — Ambulatory Visit: Payer: BC Managed Care – PPO | Admitting: Internal Medicine

## 2021-11-11 VITALS — BP 118/70 | HR 74 | Temp 98.3°F | Resp 16

## 2021-11-11 DIAGNOSIS — J4550 Severe persistent asthma, uncomplicated: Secondary | ICD-10-CM

## 2021-11-11 MED ORDER — ALBUTEROL SULFATE HFA 108 (90 BASE) MCG/ACT IN AERS: 2.0000 | INHALATION_SPRAY | RESPIRATORY_TRACT | 1 refills | Status: DC | PRN

## 2021-11-11 MED ORDER — AZELASTINE HCL 0.1 % NA SOLN: 1.0000 | Freq: Two times a day (BID) | NASAL | 2 refills | Status: DC | PRN

## 2021-11-11 MED ORDER — BUDESONIDE-FORMOTEROL FUMARATE 160-4.5 MCG/ACT IN AERO: INHALATION_SPRAY | RESPIRATORY_TRACT | 2 refills | Status: DC

## 2021-11-11 MED ORDER — FLUTICASONE PROPIONATE 50 MCG/ACT NA SUSP: 1.0000 | Freq: Two times a day (BID) | NASAL | 2 refills | Status: DC | PRN

## 2021-11-11 MED ORDER — MONTELUKAST SODIUM 10 MG PO TABS: 10.0000 mg | ORAL_TABLET | Freq: Every day | ORAL | 2 refills | Status: DC

## 2021-11-11 MED ORDER — OMEPRAZOLE 20 MG PO CPDR: 20.0000 mg | DELAYED_RELEASE_CAPSULE | Freq: Two times a day (BID) | ORAL | 1 refills | Status: DC

## 2021-11-11 MED ORDER — CETIRIZINE HCL 10 MG PO TABS: 10.0000 mg | ORAL_TABLET | Freq: Every day | ORAL | 2 refills | Status: DC

## 2021-11-11 MED ORDER — ALBUTEROL SULFATE (2.5 MG/3ML) 0.083% IN NEBU: INHALATION_SOLUTION | RESPIRATORY_TRACT | 0 refills | Status: DC

## 2021-11-11 NOTE — Patient Instructions (Addendum)
Asthma:stable ?Use albuterol 2 puffs every 6 hours as needed for the next few days and see if if helps with your chest tightness.  ?Daily controller medication(s): Symbicort 147mg 2 puffs twice a day with spacer and rinse mouth afterwards. ?Continue Spiriva 2 puffs once a day. ?Continue Singulair (montelukast) '10mg'$  daily at night. ?Continue Fasenra injections every 8 weeks. Continue to monitor your symptoms prior to these injections. ?May use albuterol rescue inhaler 2 puffs every 4 to 6 hours as needed for shortness of breath, chest tightness, coughing, and wheezing. May use albuterol rescue inhaler 2 puffs 5 to 15 minutes prior to strenuous physical activities. Monitor frequency of use.  ?Asthma control goals:  ?Full participation in all desired activities (may need albuterol before activity) ?Albuterol use two times or less a week on average (not counting use with activity) ?Cough interfering with sleep two times or less a month ?Oral steroids no more than once a year ?No hospitalizations  ? ?Allergic rhino conjunctivitis-controlled ?Use over the counter refresh eye drops to see if it helps with the eye dryness. ?Get your eyes checked. ? ?Continue Singulair (montelukast) '10mg'$  daily at night. ?Continue levocetirizine 5 mg once a day. ?Use Flonase (fluticasone) nasal spray 1 spray per nostril twice a day as needed for nasal congestion.  ?Use azelastine nasal spray 1-2 sprays per nostril twice a day as needed for runny nose/drainage. ?Nasal saline spray (i.e., Simply Saline) or nasal saline lavage (i.e., NeilMed) is recommended as needed and prior to medicated nasal sprays. ? ?Heartburn:controlled if taking medication every day ?Continue lifestyle and dietary modifications. ?Continue omeprazole '20mg'$  twice a day. ? ?Follow up in 4 months or sooner if needed.  ?If interested in allergy injections, come back for updated allergy testing.  Must stop your levo cetirizine and azelastine nasal spray 3 days prior to this  appointment ? ? ?

## 2021-12-23 ENCOUNTER — Other Ambulatory Visit: Payer: Self-pay | Admitting: Internal Medicine

## 2022-01-06 ENCOUNTER — Ambulatory Visit (INDEPENDENT_AMBULATORY_CARE_PROVIDER_SITE_OTHER): Payer: BC Managed Care – PPO

## 2022-01-06 DIAGNOSIS — J4540 Moderate persistent asthma, uncomplicated: Secondary | ICD-10-CM | POA: Diagnosis not present

## 2022-01-06 DIAGNOSIS — J4550 Severe persistent asthma, uncomplicated: Secondary | ICD-10-CM

## 2022-02-16 NOTE — Progress Notes (Deleted)
FOLLOW UP Date of Service/Encounter:  02/16/22   Subjective:  Preston Taylor (DOB: Sep 16, 1965) is a 56 y.o. male who returns to the [facility] on 02/17/2022 in re-evaluation of the following: asthma on Fasenra, allergic rhino conjunctivitis, reflux History obtained from: chart review and {Persons; PED relatives w/patient:19415::"patient"}.  For Review, LV was on 11/11/21  with Dr.Demetrias Goodbar seen for routine follow-up.  Starting to notice increase in asthma symptoms 2 weeks prior to next Fasenra injection. FEV1 80% predicted at that visit. We discussed allergy injections, but he would need to update his allergy testing first.  Pertinent History/Diagnostics:  - Asthma: Berna Bue is helping, also taking Symbicort, spiriva, singulair - Allergic Rhinitis: itchy/dry eyes. No recent eye exam                - SPT environmental panel -2014 positive to molds - GERD: controlled on PPI (omeprazole 20 mg BID), unable to wean off   Today presents for follow-up. ***  Allergies as of 02/17/2022       Reactions   Lisinopril Nausea And Vomiting        Medication List        Accurate as of February 16, 2022  4:17 PM. If you have any questions, ask your nurse or doctor.          albuterol 108 (90 Base) MCG/ACT inhaler Commonly known as: ProAir HFA Inhale 2 puffs into the lungs every 4 (four) hours as needed for wheezing or shortness of breath.   albuterol (2.5 MG/3ML) 0.083% nebulizer solution Commonly known as: PROVENTIL USE 1 VIAL IN THE NEBULIZER EVERY 4 TO 6 HOURS IF NEEDED FOR COUGH OR WHEEZE   amLODipine 10 MG tablet Commonly known as: NORVASC   azelastine 0.1 % nasal spray Commonly known as: ASTELIN Place 1-2 sprays into both nostrils 2 (two) times daily as needed (nasal drainage). Use in each nostril as directed   budesonide-formoterol 160-4.5 MCG/ACT inhaler Commonly known as: Symbicort USE 2 INHALATIONS IN THE MORNING AND AT BEDTIME WITH SPACER AND RINSE MOUTH AFTERWARDS    cetirizine 10 MG tablet Commonly known as: ZyrTEC Allergy Take 1 tablet (10 mg total) by mouth daily.   Fasenra 30 MG/ML Sosy Generic drug: Benralizumab INJECT 1 SYRINGE (30 MG) UNDER THE SKIN EVERY 8 WEEKS   fluticasone 50 MCG/ACT nasal spray Commonly known as: Flonase Place 1 spray into both nostrils 2 (two) times daily as needed (nasal congestion).   ipratropium-albuterol 0.5-2.5 (3) MG/3ML Soln Commonly known as: DUONEB Use one vial in the nebulizer every 4-6 hours if needed for cough or wheeze.   losartan 25 MG tablet Commonly known as: COZAAR   montelukast 10 MG tablet Commonly known as: SINGULAIR Take 1 tablet (10 mg total) by mouth daily.   omeprazole 20 MG capsule Commonly known as: PRILOSEC Take 1 capsule (20 mg total) by mouth 2 (two) times daily.   simvastatin 10 MG tablet Commonly known as: ZOCOR   Spiriva Respimat 1.25 MCG/ACT Aers Generic drug: Tiotropium Bromide Monohydrate Inhale 2 puffs into the lungs daily. Inhale 2 puffs once a day to prevent cough or wheeze       Past Medical History:  Diagnosis Date   Allergic rhinitis    Asthma    No past surgical history on file. Otherwise, there have been no changes to his past medical history, surgical history, family history, or social history.  ROS: All others negative except as noted per HPI.   Objective:  There were  no vitals taken for this visit. There is no height or weight on file to calculate BMI. Physical Exam: General Appearance:  Alert, cooperative, no distress, appears stated age  Head:  Normocephalic, without obvious abnormality, atraumatic  Eyes:  Conjunctiva clear, EOM's intact  Nose: Nares normal, {Blank multiple:19196:a:"***","hypertrophic turbinates","normal mucosa","no visible anterior polyps","septum midline"}  Throat: Lips, tongue normal; teeth and gums normal, {Blank multiple:19196:a:"***","normal posterior oropharynx","tonsils 2+","tonsils 3+","no tonsillar exudate","+  cobblestoning"}  Neck: Supple, symmetrical  Lungs:   {Blank multiple:19196:a:"***","clear to auscultation bilaterally","end-expiratory wheezing","wheezing throughout"}, Respirations unlabored, {Blank multiple:19196:a:"***","no coughing","intermittent dry coughing"}  Heart:  {Blank multiple:19196:a:"***","regular rate and rhythm","no murmur"}, Appears well perfused  Extremities: No edema  Skin: Skin color, texture, turgor normal, no rashes or lesions on visualized portions of skin  Neurologic: No gross deficits   Reviewed: ***  Spirometry:  Tracings reviewed. His effort: {Blank single:19197::"Good reproducible efforts.","It was hard to get consistent efforts and there is a question as to whether this reflects a maximal maneuver.","Poor effort, data can not be interpreted.","Variable effort-results affected.","decent for first attempt at spirometry."} FVC: ***L FEV1: ***L, ***% predicted FEV1/FVC ratio: ***% Interpretation: {Blank single:19197::"Spirometry consistent with mild obstructive disease","Spirometry consistent with moderate obstructive disease","Spirometry consistent with severe obstructive disease","Spirometry consistent with possible restrictive disease","Spirometry consistent with mixed obstructive and restrictive disease","Spirometry uninterpretable due to technique","Spirometry consistent with normal pattern","No overt abnormalities noted given today's efforts"}.  Please see scanned spirometry results for details.  Skin Testing: {Blank single:19197::"Select foods","Environmental allergy panel","Environmental allergy panel and select foods","Food allergy panel","None","Deferred due to recent antihistamines use","deferred due to recent reaction"}. ***Adequate positive and negative controls Results discussed with patient/family.   {Blank single:19197::"Allergy testing results were read and interpreted by myself, documented by clinical staff."," "}  Assessment/Plan   ***  Sigurd Sos, MD  [facility]

## 2022-02-17 ENCOUNTER — Ambulatory Visit: Payer: BC Managed Care – PPO | Admitting: Internal Medicine

## 2022-03-03 ENCOUNTER — Ambulatory Visit: Payer: BC Managed Care – PPO

## 2022-03-03 ENCOUNTER — Ambulatory Visit (INDEPENDENT_AMBULATORY_CARE_PROVIDER_SITE_OTHER): Payer: BC Managed Care – PPO | Admitting: *Deleted

## 2022-03-03 DIAGNOSIS — J4550 Severe persistent asthma, uncomplicated: Secondary | ICD-10-CM

## 2022-03-15 DIAGNOSIS — N529 Male erectile dysfunction, unspecified: Secondary | ICD-10-CM | POA: Insufficient documentation

## 2022-03-15 DIAGNOSIS — E785 Hyperlipidemia, unspecified: Secondary | ICD-10-CM | POA: Insufficient documentation

## 2022-03-15 NOTE — Patient Instructions (Incomplete)
Asthma Continue montelukast 10 mg once a day to prevent cough or wheeze Continue Symbicort 160-2 puffs twice a day to prevent cough or wheeze Continue Spiriva 1.25 mcg - 2 puffs once a day to prevent cough or wheeze Continue albuterol 2 puffs every 4 hours as needed for cough or wheeze OR Instead use albuterol 0.083% solution via nebulizer one unit vial every 4 hours as needed for cough or wheeze You may use albuterol 2 puffs 5 to 15 minutes before activity to decrease cough or wheeze Continue Fasenra 30 mg injections once every 8 weeks and have access to an epinephrine auto-injector  Allergic rhinitis Continue levocetirizine 5 mg once a day as needed for runny nose or itch Continue Flonase 2 sprays in each nostril once a day as needed for stuffy nose. In the right nostril, point the applicator out toward the right ear. In the left nostril, point the applicator out toward the left ear Consider saline nasal rinses as needed for nasal symptoms. Use this before any medicated nasal sprays for best result Continue azelastine 2 sprays in each nostril twice a day as needed for runny nose Consider updating your environmental allergy testing. Remember to stop antihistamines for 3 days before your testing appointment  Allergic conjunctivitis Some over the counter eye drops include Pataday one drop in each eye once a day as needed for red, itchy eyes OR Zaditor one drop in each eye twice a day as needed for red itchy eyes. Recommend use of a lubricant eyedrop as needed Recommend eye exam  Reflux Continue dietary lifestyle modifications as listed below Begin omeprazole 40 mg once a day to prevent reflux.  Take this medication 30 minutes before your first meal.  This will replace omeprazole 20 mg twice a day  Your blood pressure was elevated at today's visit.  Follow-up with your primary care provider for management and treatment of your blood pressure  Call the clinic if this treatment plan is not  working well for you  Follow up in 6 months or sooner if needed.   Lifestyle Changes for Controlling GERD When you have GERD, stomach acid feels as if it's backing up toward your mouth. Whether or not you take medication to control your GERD, your symptoms can often be improved with lifestyle changes.   Raise Your Head Reflux is more likely to strike when you're lying down flat, because stomach fluid can flow backward more easily. Raising the head of your bed 4-6 inches can help. To do this: Slide blocks or books under the legs at the head of your bed. Or, place a wedge under the mattress. Many foam stores can make a suitable wedge for you. The wedge should run from your waist to the top of your head. Don't just prop your head on several pillows. This increases pressure on your stomach. It can make GERD worse.  Watch Your Eating Habits Certain foods may increase the acid in your stomach or relax the lower esophageal sphincter, making GERD more likely. It's best to avoid the following: Coffee, tea, and carbonated drinks (with and without caffeine) Fatty, fried, or spicy food Mint, chocolate, onions, and tomatoes Any other foods that seem to irritate your stomach or cause you pain  Relieve the Pressure Eat smaller meals, even if you have to eat more often. Don't lie down right after you eat. Wait a few hours for your stomach to empty. Avoid tight belts and tight-fitting clothes. Lose excess weight.  Tobacco and Alcohol Avoid  smoking tobacco and drinking alcohol. They can make GERD symptoms worse.

## 2022-03-15 NOTE — Progress Notes (Unsigned)
   [address]  [place] [id number] Dept: [phone]  FOLLOW UP NOTE  Patient ID: Preston Taylor, male    DOB: [birth year]  Age: 56 y.o. MRN: [id number] Date of Office Visit: 03/16/2022  Assessment  Chief Complaint: No chief complaint on file.  HPI Preston Taylor is a 56 year old male who presents to the clinic for follow-up visit.  He was last seen in this clinic on 11/11/2021 by Dr. Simona Huh for evaluation of asthma, allergic rhinitis, allergic conjunctivitis, and reflux.  His last environmental allergy skin testing was in 2014 and was positive to molds.   Drug Allergies:  Allergies  Allergen Reactions   Lisinopril Nausea And Vomiting    Physical Exam: There were no vitals taken for this visit.   Physical Exam  Diagnostics:    Assessment and Plan: No diagnosis found.  No orders of the defined types were placed in this encounter.   There are no Patient Instructions on file for this visit.  No follow-ups on file.    Thank you for the opportunity to care for this patient.  Please do not hesitate to contact me with questions.  Gareth Morgan, FNP [facility]

## 2022-03-16 ENCOUNTER — Ambulatory Visit (INDEPENDENT_AMBULATORY_CARE_PROVIDER_SITE_OTHER): Payer: BC Managed Care – PPO | Admitting: Family Medicine

## 2022-03-16 ENCOUNTER — Encounter: Payer: Self-pay | Admitting: Family Medicine

## 2022-03-16 VITALS — BP 142/80 | HR 75 | Temp 98.6°F | Resp 24 | Wt 162.6 lb

## 2022-03-16 DIAGNOSIS — H1013 Acute atopic conjunctivitis, bilateral: Secondary | ICD-10-CM | POA: Diagnosis not present

## 2022-03-16 DIAGNOSIS — J4550 Severe persistent asthma, uncomplicated: Secondary | ICD-10-CM | POA: Diagnosis not present

## 2022-03-16 DIAGNOSIS — J3089 Other allergic rhinitis: Secondary | ICD-10-CM

## 2022-03-16 DIAGNOSIS — K219 Gastro-esophageal reflux disease without esophagitis: Secondary | ICD-10-CM

## 2022-03-16 MED ORDER — MONTELUKAST SODIUM 10 MG PO TABS: 10.0000 mg | ORAL_TABLET | Freq: Every day | ORAL | 1 refills | Status: DC

## 2022-03-16 MED ORDER — OMEPRAZOLE 40 MG PO CPDR
40.0000 mg | DELAYED_RELEASE_CAPSULE | Freq: Every day | ORAL | 1 refills | Status: DC
Start: 2022-03-16 — End: 2022-08-31

## 2022-03-16 MED ORDER — ALBUTEROL SULFATE HFA 108 (90 BASE) MCG/ACT IN AERS: 2.0000 | INHALATION_SPRAY | Freq: Four times a day (QID) | RESPIRATORY_TRACT | 2 refills | Status: DC | PRN

## 2022-03-16 MED ORDER — FLUTICASONE PROPIONATE 50 MCG/ACT NA SUSP: 1.0000 | Freq: Two times a day (BID) | NASAL | 2 refills | Status: DC | PRN

## 2022-03-16 MED ORDER — SPIRIVA RESPIMAT 1.25 MCG/ACT IN AERS: 2.0000 | INHALATION_SPRAY | Freq: Every day | RESPIRATORY_TRACT | 2 refills | Status: DC

## 2022-03-16 MED ORDER — BUDESONIDE-FORMOTEROL FUMARATE 160-4.5 MCG/ACT IN AERO
INHALATION_SPRAY | RESPIRATORY_TRACT | 2 refills | Status: DC
Start: 2022-03-16 — End: 2022-10-13

## 2022-03-16 MED ORDER — AZELASTINE HCL 0.1 % NA SOLN: 1.0000 | Freq: Two times a day (BID) | NASAL | 1 refills | Status: DC | PRN

## 2022-04-28 ENCOUNTER — Ambulatory Visit (INDEPENDENT_AMBULATORY_CARE_PROVIDER_SITE_OTHER): Payer: BC Managed Care – PPO | Admitting: *Deleted

## 2022-04-28 DIAGNOSIS — J4550 Severe persistent asthma, uncomplicated: Secondary | ICD-10-CM | POA: Diagnosis not present

## 2022-05-25 ENCOUNTER — Other Ambulatory Visit: Payer: Self-pay | Admitting: Allergy

## 2022-06-23 ENCOUNTER — Ambulatory Visit (INDEPENDENT_AMBULATORY_CARE_PROVIDER_SITE_OTHER): Payer: BC Managed Care – PPO | Admitting: *Deleted

## 2022-06-23 DIAGNOSIS — J4550 Severe persistent asthma, uncomplicated: Secondary | ICD-10-CM | POA: Diagnosis not present

## 2022-07-19 ENCOUNTER — Other Ambulatory Visit: Payer: Self-pay | Admitting: Family Medicine

## 2022-08-04 ENCOUNTER — Other Ambulatory Visit: Payer: Self-pay | Admitting: Allergy

## 2022-08-18 ENCOUNTER — Ambulatory Visit (INDEPENDENT_AMBULATORY_CARE_PROVIDER_SITE_OTHER): Payer: BC Managed Care – PPO

## 2022-08-18 DIAGNOSIS — J4550 Severe persistent asthma, uncomplicated: Secondary | ICD-10-CM

## 2022-08-26 DIAGNOSIS — M545 Low back pain, unspecified: Secondary | ICD-10-CM | POA: Diagnosis not present

## 2022-08-26 DIAGNOSIS — E785 Hyperlipidemia, unspecified: Secondary | ICD-10-CM | POA: Diagnosis not present

## 2022-08-26 DIAGNOSIS — G8929 Other chronic pain: Secondary | ICD-10-CM | POA: Diagnosis not present

## 2022-08-26 DIAGNOSIS — J45909 Unspecified asthma, uncomplicated: Secondary | ICD-10-CM | POA: Diagnosis not present

## 2022-08-26 DIAGNOSIS — Z131 Encounter for screening for diabetes mellitus: Secondary | ICD-10-CM | POA: Diagnosis not present

## 2022-08-26 DIAGNOSIS — I10 Essential (primary) hypertension: Secondary | ICD-10-CM | POA: Diagnosis not present

## 2022-08-26 DIAGNOSIS — Z125 Encounter for screening for malignant neoplasm of prostate: Secondary | ICD-10-CM | POA: Diagnosis not present

## 2022-08-31 ENCOUNTER — Other Ambulatory Visit: Payer: Self-pay | Admitting: Family Medicine

## 2022-09-16 DIAGNOSIS — I10 Essential (primary) hypertension: Secondary | ICD-10-CM | POA: Diagnosis not present

## 2022-09-16 DIAGNOSIS — M545 Low back pain, unspecified: Secondary | ICD-10-CM | POA: Diagnosis not present

## 2022-09-16 DIAGNOSIS — J45909 Unspecified asthma, uncomplicated: Secondary | ICD-10-CM | POA: Diagnosis not present

## 2022-09-22 ENCOUNTER — Ambulatory Visit: Payer: BC Managed Care – PPO | Admitting: Internal Medicine

## 2022-10-12 NOTE — Progress Notes (Unsigned)
FOLLOW UP Date of Service/Encounter:  10/13/22   Subjective:  Preston Taylor (DOB: May 15, 1966) is a 57 y.o. male who returns to the [facility] on 10/13/2022 in re-evaluation of the following: asthma, allergic rhinitis, allergic conjunctivitis, and reflux  History obtained from: chart review and patient.  For Review, LV was on 03/16/22  with Gareth Morgan, FNP seen for routine follow-up. Still reporting chest tightness starting one week prior to next fasenra injection. Not interested in changing biologics. FEV1 2.56L  Pertinent History/Diagnostics:  - Asthma: Berna Bue is helping, also taking Symbicort, spiriva, singulair - Allergic Rhinitis: itchy/dry eyes. No recent eye exam                - SPT environmental panel -2014 positive to molds - GERD: controlled on PPI (omeprazole 20 mg BID), unable to wean off   Today presents for follow-up. He was prescribed omeprazole 40 mg and it was making him nauseous so he has been only trying to take half of caspule and throwing the other half out. Doing this twice daily. With this dosing, his reflux is controlled. He would like to go back to 20 mg Bid.   He is tolerating his fasenra injections and continues to notice asthma acting up around 2 weeks prior to the injection. He does not want to switch to a different biologic. No steroids or antibiotics since last visit.  He uses nebulizer prior to sleep out of habit and to make sure he sleeps well every night. It does help him sleep.  Not using outside of this. He is using symbicort + spiriva. Received a letter that symbicort may no longer be covered.   He was taking zyrtec twice daily and his eyes have been really dry.  He went back to once daily and symptoms controlled. He continues taking flonase and azelastine nasal sprays. He is not interested in allergy testing as his symptoms are not very bothersome.  Last injectoin 08/18/22, coming q8 wks.   Allergies as of 10/13/2022       Reactions    Lisinopril Nausea And Vomiting        Medication List        Accurate as of October 13, 2022  2:54 PM. If you have any questions, ask your nurse or doctor.          STOP taking these medications    budesonide-formoterol 160-4.5 MCG/ACT inhaler Commonly known as: Symbicort Stopped by: Clemon Chambers, MD   omeprazole 40 MG capsule Commonly known as: PRILOSEC Stopped by: Clemon Chambers, MD       TAKE these medications    albuterol (2.5 MG/3ML) 0.083% nebulizer solution Commonly known as: PROVENTIL USE 1 VIAL IN THE NEBULIZER EVERY 4 TO 6 HOURS IF NEEDED FOR COUGH OR WHEEZE   albuterol 108 (90 Base) MCG/ACT inhaler Commonly known as: VENTOLIN HFA USE 2 INHALATIONS EVERY 6 HOURS AS NEEDED FOR WHEEZING OR SHORTNESS OF BREATH   amLODipine 10 MG tablet Commonly known as: NORVASC   azelastine 0.1 % nasal spray Commonly known as: ASTELIN Place 1-2 sprays into both nostrils 2 (two) times daily as needed (nasal drainage).   cetirizine 10 MG tablet Commonly known as: ZYRTEC TAKE 1 TABLET(10 MG) BY MOUTH DAILY   Fasenra 30 MG/ML Sosy Generic drug: Benralizumab INJECT 1 SYRINGE (30 MG) UNDER THE SKIN EVERY 8 WEEKS   fluticasone 50 MCG/ACT nasal spray Commonly known as: FLONASE USE 1 SPRAY IN EACH NOSTRIL TWICE A DAY AS  NEEDED FOR NASAL CONGESTION   ipratropium-albuterol 0.5-2.5 (3) MG/3ML Soln Commonly known as: DUONEB Use one vial in the nebulizer every 4-6 hours if needed for cough or wheeze.   losartan 25 MG tablet Commonly known as: COZAAR   montelukast 10 MG tablet Commonly known as: SINGULAIR Take 1 tablet (10 mg total) by mouth daily.   naproxen 500 MG tablet Commonly known as: NAPROSYN Take by mouth.   omeprazole 20 MG tablet Commonly known as: PriLOSEC OTC Take 1 tablet (20 mg total) by mouth in the morning and at bedtime. Started by: Clemon Chambers, MD   simvastatin 10 MG tablet Commonly known as: ZOCOR   Spiriva Respimat 1.25 MCG/ACT  Aers Generic drug: Tiotropium Bromide Monohydrate Inhale 2 puffs into the lungs daily. Inhale 2 puffs once a day to prevent cough or wheeze       Past Medical History:  Diagnosis Date   Allergic rhinitis    Asthma    No past surgical history on file. Otherwise, there have been no changes to his past medical history, surgical history, family history, or social history.  ROS: All others negative except as noted per HPI.   Objective:  BP (!) 150/90   Pulse 87   Temp 98.6 F (37 C)   Resp (!) 28   Ht '5\' 7"'$  (1.702 m)   Wt 156 lb 9.6 oz (71 kg)   SpO2 (!) 86%   BMI 24.53 kg/m  Body mass index is 24.53 kg/m. Physical Exam: General Appearance:  Alert, cooperative, no distress, appears stated age  Head:  Normocephalic, without obvious abnormality, atraumatic  Eyes:  Conjunctiva clear, EOM's intact  Nose: Nares normal,  septum deviated to right posteriorly, hypertrophic turbinates, normal mucosa, and no visible anterior polyps  Throat: Lips, tongue normal; teeth and gums normal, normal posterior oropharynx  Neck: Supple, symmetrical  Lungs:   clear to auscultation bilaterally, Respirations unlabored, no coughing  Heart:  regular rate and rhythm and no murmur, Appears well perfused  Extremities: No edema  Skin: Skin color, texture, turgor normal, no rashes or lesions on visualized portions of skin  Neurologic: No gross deficits   Spirometry:  Tracings reviewed. His effort: Good reproducible efforts. FVC: 3.63L FEV1: 3.13L, 99% predicted FEV1/FVC ratio: 0.86 Interpretation: Spirometry consistent with normal pattern.  Please see scanned spirometry results for details.  Assessment/Plan   Asthma-chronic, stable on fasenra Continue montelukast 10 mg once a day to prevent cough or wheeze Start Dulera 200 mcg-2 puffs twice a day to prevent cough or wheeze-this will replace Symbicort (insurance coverage) Continue Spiriva 1.25 mcg - 2 puffs once a day to prevent cough or  wheeze Start Airsupra 2 puffs every 4 hours as needed for cough or wheeze (maiximum 12 puffs/day) OR Instead use albuterol 0.083% solution via nebulizer one unit vial every 4 hours as needed for cough or wheeze You may use Airsupra 2 puffs 5 to 15 minutes before activity to decrease cough or wheeze Airsupra will replace albuterol inhaler Continue Fasenra 30 mg injections once every 8 weeks and have access to an epinephrine auto-injector  Allergic rhinitis-controlled Continue cetirizine 10 mg once a day as needed for runny nose or itch Continue Flonase 2 sprays in each nostril once a day as needed for stuffy nose. In the right nostril, point the applicator out toward the right ear. In the left nostril, point the applicator out toward the left ear Consider saline nasal rinses as needed for nasal symptoms. Use this before any  medicated nasal sprays for best result Continue azelastine 2 sprays in each nostril twice a day as needed for runny nose Consider updating your environmental allergy testing if symptoms worsen.  Allergic conjunctivitis-controlled Some over the counter eye drops include Pataday one drop in each eye once a day as needed for red, itchy eyes OR Zaditor one drop in each eye twice a day as needed for red itchy eyes. Recommend use of a lubricant eyedrop as needed Recommend eye exam  Reflux-controlled Continue dietary lifestyle modifications as listed below Restart omeprazole 20 mg twice a day to prevent reflux.  Take this medication 30 minutes before your first meal.  Your blood pressure was elevated at today's visit.  Follow-up with your primary care provider for management and treatment of your blood pressure  Call the clinic if this treatment plan is not working well for you  Follow up in 6 months or sooner if needed.  Sigurd Sos, MD  [facility]

## 2022-10-13 ENCOUNTER — Other Ambulatory Visit: Payer: Self-pay

## 2022-10-13 ENCOUNTER — Ambulatory Visit: Payer: BC Managed Care – PPO

## 2022-10-13 ENCOUNTER — Ambulatory Visit (INDEPENDENT_AMBULATORY_CARE_PROVIDER_SITE_OTHER): Payer: BC Managed Care – PPO | Admitting: Internal Medicine

## 2022-10-13 ENCOUNTER — Encounter: Payer: Self-pay | Admitting: Internal Medicine

## 2022-10-13 VITALS — BP 150/90 | HR 87 | Temp 98.6°F | Resp 28 | Ht 67.0 in | Wt 156.6 lb

## 2022-10-13 DIAGNOSIS — K219 Gastro-esophageal reflux disease without esophagitis: Secondary | ICD-10-CM

## 2022-10-13 DIAGNOSIS — H1013 Acute atopic conjunctivitis, bilateral: Secondary | ICD-10-CM | POA: Diagnosis not present

## 2022-10-13 DIAGNOSIS — J3089 Other allergic rhinitis: Secondary | ICD-10-CM

## 2022-10-13 DIAGNOSIS — J4550 Severe persistent asthma, uncomplicated: Secondary | ICD-10-CM | POA: Diagnosis not present

## 2022-10-13 MED ORDER — AZELASTINE HCL 0.1 % NA SOLN: 1.0000 | Freq: Two times a day (BID) | NASAL | 1 refills | Status: DC | PRN

## 2022-10-13 MED ORDER — CETIRIZINE HCL 10 MG PO TABS: ORAL_TABLET | ORAL | 2 refills | Status: DC

## 2022-10-13 MED ORDER — OMEPRAZOLE MAGNESIUM 20 MG PO TBEC: 20.0000 mg | DELAYED_RELEASE_TABLET | Freq: Two times a day (BID) | ORAL | 5 refills | Status: DC

## 2022-10-13 MED ORDER — SPIRIVA RESPIMAT 1.25 MCG/ACT IN AERS: 2.0000 | INHALATION_SPRAY | Freq: Every day | RESPIRATORY_TRACT | 5 refills | Status: DC

## 2022-10-13 MED ORDER — DULERA 200-5 MCG/ACT IN AERO: 2.0000 | INHALATION_SPRAY | Freq: Two times a day (BID) | RESPIRATORY_TRACT | 5 refills | Status: DC

## 2022-10-13 MED ORDER — ALBUTEROL SULFATE (2.5 MG/3ML) 0.083% IN NEBU: INHALATION_SOLUTION | RESPIRATORY_TRACT | 10 refills | Status: DC

## 2022-10-13 MED ORDER — AIRSUPRA 90-80 MCG/ACT IN AERO: 2.0000 | INHALATION_SPRAY | RESPIRATORY_TRACT | 5 refills | Status: DC | PRN

## 2022-10-13 MED ORDER — FLUTICASONE PROPIONATE 50 MCG/ACT NA SUSP: NASAL | 3 refills | Status: DC

## 2022-10-13 MED ORDER — MONTELUKAST SODIUM 10 MG PO TABS: 10.0000 mg | ORAL_TABLET | Freq: Every day | ORAL | 1 refills | Status: DC

## 2022-10-13 NOTE — Patient Instructions (Addendum)
Asthma Continue montelukast 10 mg once a day to prevent cough or wheeze Start Dulera 200 mcg-2 puffs twice a day to prevent cough or wheeze-this will replace Symbicort (switching due to insurance coverage) Continue Spiriva 1.25 mcg - 2 puffs once a day to prevent cough or wheeze Start Airsupra 2 puffs every 4 hours as needed for cough or wheeze (maiximum 12 puffs/day) OR Instead use albuterol 0.083% solution via nebulizer one unit vial every 4 hours as needed for cough or wheeze You may use Airsupra 2 puffs 5 to 15 minutes before activity to decrease cough or wheeze Airsupra will replace albuterol inhaler Continue Fasenra 30 mg injections once every 8 weeks and have access to an epinephrine auto-injector  Allergic rhinitis Continue cetirizine 10 mg once a day as needed for runny nose or itch Continue Flonase 2 sprays in each nostril once a day as needed for stuffy nose. In the right nostril, point the applicator out toward the right ear. In the left nostril, point the applicator out toward the left ear Consider saline nasal rinses as needed for nasal symptoms. Use this before any medicated nasal sprays for best result Continue azelastine 2 sprays in each nostril twice a day as needed for runny nose Consider updating your environmental allergy testing if symptoms worsen.  Allergic conjunctivitis Some over the counter eye drops include Pataday one drop in each eye once a day as needed for red, itchy eyes OR Zaditor one drop in each eye twice a day as needed for red itchy eyes. Recommend use of a lubricant eyedrop as needed Recommend eye exam  Reflux Continue dietary lifestyle modifications as listed below Restart omeprazole 20 mg twice a day to prevent reflux.  Take this medication 30 minutes before your first meal.  Your blood pressure was elevated at today's visit.  Follow-up with your primary care provider for management and treatment of your blood pressure  Call the clinic if this  treatment plan is not working well for you  Follow up in 6 months or sooner if needed.   Lifestyle Changes for Controlling GERD When you have GERD, stomach acid feels as if it's backing up toward your mouth. Whether or not you take medication to control your GERD, your symptoms can often be improved with lifestyle changes.   Raise Your Head Reflux is more likely to strike when you're lying down flat, because stomach fluid can flow backward more easily. Raising the head of your bed 4-6 inches can help. To do this: Slide blocks or books under the legs at the head of your bed. Or, place a wedge under the mattress. Many foam stores can make a suitable wedge for you. The wedge should run from your waist to the top of your head. Don't just prop your head on several pillows. This increases pressure on your stomach. It can make GERD worse.  Watch Your Eating Habits Certain foods may increase the acid in your stomach or relax the lower esophageal sphincter, making GERD more likely. It's best to avoid the following: Coffee, tea, and carbonated drinks (with and without caffeine) Fatty, fried, or spicy food Mint, chocolate, onions, and tomatoes Any other foods that seem to irritate your stomach or cause you pain  Relieve the Pressure Eat smaller meals, even if you have to eat more often. Don't lie down right after you eat. Wait a few hours for your stomach to empty. Avoid tight belts and tight-fitting clothes. Lose excess weight.  Tobacco and Alcohol Avoid smoking  tobacco and drinking alcohol. They can make GERD symptoms worse.

## 2022-10-22 ENCOUNTER — Other Ambulatory Visit: Payer: Self-pay

## 2022-11-11 ENCOUNTER — Telehealth: Payer: Self-pay

## 2022-11-11 MED ORDER — AIRSUPRA 90-80 MCG/ACT IN AERO: 2.0000 | INHALATION_SPRAY | RESPIRATORY_TRACT | 5 refills | Status: DC | PRN

## 2022-11-11 MED ORDER — SPIRIVA RESPIMAT 1.25 MCG/ACT IN AERS: 2.0000 | INHALATION_SPRAY | Freq: Every day | RESPIRATORY_TRACT | 5 refills | Status: DC

## 2022-11-11 NOTE — Telephone Encounter (Signed)
Patient called in - DOB/Pharmacy verified - requested Spiriva and AirSupra prescriptions be sent to [facility] due to coupons not being accepted w/[facility].  Patient advised prescriptions would be electronically sent in to [facility].  Patient verbalized understanding, no further questions.

## 2022-11-26 DIAGNOSIS — Z1211 Encounter for screening for malignant neoplasm of colon: Secondary | ICD-10-CM | POA: Diagnosis not present

## 2022-11-26 DIAGNOSIS — I10 Essential (primary) hypertension: Secondary | ICD-10-CM | POA: Diagnosis not present

## 2022-11-26 DIAGNOSIS — Z23 Encounter for immunization: Secondary | ICD-10-CM | POA: Diagnosis not present

## 2022-11-26 DIAGNOSIS — Z0000 Encounter for general adult medical examination without abnormal findings: Secondary | ICD-10-CM | POA: Diagnosis not present

## 2022-12-08 ENCOUNTER — Ambulatory Visit (INDEPENDENT_AMBULATORY_CARE_PROVIDER_SITE_OTHER): Payer: BC Managed Care – PPO

## 2022-12-08 DIAGNOSIS — J4550 Severe persistent asthma, uncomplicated: Secondary | ICD-10-CM | POA: Diagnosis not present

## 2022-12-17 ENCOUNTER — Other Ambulatory Visit: Payer: Self-pay | Admitting: Internal Medicine

## 2023-02-02 ENCOUNTER — Ambulatory Visit (INDEPENDENT_AMBULATORY_CARE_PROVIDER_SITE_OTHER): Payer: BC Managed Care – PPO

## 2023-02-02 DIAGNOSIS — J4550 Severe persistent asthma, uncomplicated: Secondary | ICD-10-CM | POA: Diagnosis not present

## 2023-02-07 ENCOUNTER — Other Ambulatory Visit: Payer: Self-pay | Admitting: Family Medicine

## 2023-03-07 ENCOUNTER — Other Ambulatory Visit: Payer: Self-pay | Admitting: *Deleted

## 2023-03-07 DIAGNOSIS — K5289 Other specified noninfective gastroenteritis and colitis: Secondary | ICD-10-CM | POA: Diagnosis not present

## 2023-03-07 DIAGNOSIS — Z86010 Personal history of colonic polyps: Secondary | ICD-10-CM | POA: Diagnosis not present

## 2023-03-07 DIAGNOSIS — Z09 Encounter for follow-up examination after completed treatment for conditions other than malignant neoplasm: Secondary | ICD-10-CM | POA: Diagnosis not present

## 2023-03-07 DIAGNOSIS — D123 Benign neoplasm of transverse colon: Secondary | ICD-10-CM | POA: Diagnosis not present

## 2023-03-07 DIAGNOSIS — Z1211 Encounter for screening for malignant neoplasm of colon: Secondary | ICD-10-CM | POA: Diagnosis not present

## 2023-03-07 DIAGNOSIS — K6389 Other specified diseases of intestine: Secondary | ICD-10-CM | POA: Diagnosis not present

## 2023-03-07 DIAGNOSIS — K5190 Ulcerative colitis, unspecified, without complications: Secondary | ICD-10-CM | POA: Diagnosis not present

## 2023-03-07 MED ORDER — AZELASTINE HCL 0.1 % NA SOLN: 1.0000 | Freq: Two times a day (BID) | NASAL | 0 refills | Status: DC | PRN

## 2023-03-30 ENCOUNTER — Ambulatory Visit (INDEPENDENT_AMBULATORY_CARE_PROVIDER_SITE_OTHER): Payer: BC Managed Care – PPO | Admitting: *Deleted

## 2023-03-30 DIAGNOSIS — J4550 Severe persistent asthma, uncomplicated: Secondary | ICD-10-CM | POA: Diagnosis not present

## 2023-04-04 ENCOUNTER — Ambulatory Visit (INDEPENDENT_AMBULATORY_CARE_PROVIDER_SITE_OTHER): Payer: BC Managed Care – PPO | Admitting: Internal Medicine

## 2023-04-04 ENCOUNTER — Other Ambulatory Visit: Payer: Self-pay

## 2023-04-04 ENCOUNTER — Encounter: Payer: Self-pay | Admitting: Internal Medicine

## 2023-04-04 VITALS — BP 128/84 | HR 88 | Temp 98.8°F | Resp 18 | Ht 65.0 in | Wt 156.5 lb

## 2023-04-04 DIAGNOSIS — H1010 Acute atopic conjunctivitis, unspecified eye: Secondary | ICD-10-CM

## 2023-04-04 DIAGNOSIS — J4550 Severe persistent asthma, uncomplicated: Secondary | ICD-10-CM

## 2023-04-04 DIAGNOSIS — H1013 Acute atopic conjunctivitis, bilateral: Secondary | ICD-10-CM | POA: Diagnosis not present

## 2023-04-04 DIAGNOSIS — K219 Gastro-esophageal reflux disease without esophagitis: Secondary | ICD-10-CM | POA: Diagnosis not present

## 2023-04-04 DIAGNOSIS — J3089 Other allergic rhinitis: Secondary | ICD-10-CM

## 2023-04-04 MED ORDER — SPIRIVA RESPIMAT 1.25 MCG/ACT IN AERS: 2.0000 | INHALATION_SPRAY | Freq: Every day | RESPIRATORY_TRACT | 5 refills | Status: DC

## 2023-04-04 MED ORDER — FLUTICASONE PROPIONATE 50 MCG/ACT NA SUSP: NASAL | 5 refills | Status: DC

## 2023-04-04 MED ORDER — AZELASTINE HCL 0.1 % NA SOLN: 1.0000 | Freq: Two times a day (BID) | NASAL | 1 refills | Status: DC | PRN

## 2023-04-04 MED ORDER — MONTELUKAST SODIUM 10 MG PO TABS: 10.0000 mg | ORAL_TABLET | Freq: Every day | ORAL | 1 refills | Status: DC

## 2023-04-04 MED ORDER — BEPOTASTINE BESILATE 1.5 % OP SOLN: 1.0000 [drp] | Freq: Two times a day (BID) | OPHTHALMIC | 5 refills | Status: DC | PRN

## 2023-04-04 MED ORDER — AIRSUPRA 90-80 MCG/ACT IN AERO: 2.0000 | INHALATION_SPRAY | RESPIRATORY_TRACT | 1 refills | Status: DC | PRN

## 2023-04-04 MED ORDER — BUDESONIDE-FORMOTEROL FUMARATE 160-4.5 MCG/ACT IN AERO: 2.0000 | INHALATION_SPRAY | Freq: Two times a day (BID) | RESPIRATORY_TRACT | 5 refills | Status: DC

## 2023-04-04 MED ORDER — OMEPRAZOLE MAGNESIUM 20 MG PO TBEC: 20.0000 mg | DELAYED_RELEASE_TABLET | Freq: Two times a day (BID) | ORAL | 5 refills | Status: DC

## 2023-04-04 NOTE — Patient Instructions (Addendum)
Asthma Continue montelukast 10 mg once a day to prevent cough or wheeze Continue Symbicort 160 mcg-2 puffs twice a day to prevent cough or wheeze-this will replace Symbicort (switching due to insurance coverage) Continue Spiriva 1.25 mcg - 2 puffs once a day to prevent cough or wheeze Airsupra 2 puffs every 4 hours as needed for cough or wheeze (maiximum 12 puffs/day) OR Instead use albuterol 0.083% solution via nebulizer one unit vial every 4 hours as needed for cough or wheeze You may use Airsupra 2 puffs 5 to 15 minutes before activity to decrease cough or wheeze Continue Fasenra 30 mg injections once every 8 weeks and have access to an epinephrine auto-injector  Allergic rhinitis Continue xyzal 5mg  twice a day as needed for runny nose or itch Continue Flonase 2 sprays in each nostril once a day as needed for stuffy nose. In the right nostril, point the applicator out toward the right ear. In the left nostril, point the applicator out toward the left ear Consider saline nasal rinses as needed for nasal symptoms. Use this before any medicated nasal sprays for best result Continue azelastine 2 sprays in each nostril twice a day as needed for runny nose Consider updating your environmental allergy testing if symptoms worsen.  Allergic conjunctivitis Will send bepreve to see if covered-1 drop each eye twice daily as needed. If not covered, use pataday or zaditor. Recommend use of a lubricant eyedrop as needed Recommend eye exam  Reflux Continue dietary lifestyle modifications as listed below Continue omeprazole 20 mg twice a day to prevent reflux.  Take this medication 30 minutes before your first meal. Consider GI referral   Call the clinic if this treatment plan is not working well for you  Follow up in 6 months or sooner if needed.  It was a pleasure seeing you again in clinic today! Thank you for allowing me to participate in your care.  Tonny Bollman, MD [facility]    Lifestyle Changes for Controlling GERD When you have GERD, stomach acid feels as if it's backing up toward your mouth. Whether or not you take medication to control your GERD, your symptoms can often be improved with lifestyle changes.   Raise Your Head Reflux is more likely to strike when you're lying down flat, because stomach fluid can flow backward more easily. Raising the head of your bed 4-6 inches can help. To do this: Slide blocks or books under the legs at the head of your bed. Or, place a wedge under the mattress. Many foam stores can make a suitable wedge for you. The wedge should run from your waist to the top of your head. Don't just prop your head on several pillows. This increases pressure on your stomach. It can make GERD worse.  Watch Your Eating Habits Certain foods may increase the acid in your stomach or relax the lower esophageal sphincter, making GERD more likely. It's best to avoid the following: Coffee, tea, and carbonated drinks (with and without caffeine) Fatty, fried, or spicy food Mint, chocolate, onions, and tomatoes Any other foods that seem to irritate your stomach or cause you pain  Relieve the Pressure Eat smaller meals, even if you have to eat more often. Don't lie down right after you eat. Wait a few hours for your stomach to empty. Avoid tight belts and tight-fitting clothes. Lose excess weight.  Tobacco and Alcohol Avoid smoking tobacco and drinking alcohol. They can make GERD symptoms worse.

## 2023-04-04 NOTE — Progress Notes (Signed)
FOLLOW UP Date of Service/Encounter:  04/04/23  Subjective:  Preston Taylor (DOB: 04-29-1966) is a 57 y.o. male who [facility] in re-evaluation of the following: asthma, allergic rhinitis, allergic conjunctivitis, and reflux  History obtained from: chart review and patient.  For Review, LV was on 10/13/22  with Dr.Deanie Jupiter seen for routine follow-up. See below for summary of history and diagnostics.   Therapeutic plans/changes recommended: Reflux better controlled on omeprazole 20 mg BID dosing. Tolerating fasenra but noticing asthma symptoms increase around week 6 from injection. FEV1 99%.  Switched to [place] 200 due to insurance coverage  (from [employer]), started [place]. Continued fasenra, cetirizine, flonase, saline spray and azelastine nasal spray,  Restarted omeprazole 20 mg BID. ----------------------------------------------------- Pertinent History/Diagnostics:  - Asthma: Harrington Challenger is helping, also taking Symbicort, spiriva, singulair - Allergic Rhinitis: itchy/dry eyes. No recent eye exam. Dry eyes less bothersome when not taking AH BID. Not interested in AIT.                - SPT environmental panel -2014 positive to molds - GERD: controlled on PPI (omeprazole 20 mg BID), unable to wean off  --------------------------------------------------- Today presents for follow-up. Continues using rescue inhaler around 3 times per week.  He feels controlled. No systemic steroids of antibiotics since last visit.  He is taking his Symbicort 160 mcg, 2 puffs twice daily plus Spiriva 2 puffs daily.  He received his last fasenra 03/30/23, continues to tolerate these well.  He is still having an increase in asthma symptoms around every 6 weeks. He does not want to shorten therapy interval on his Harrington Challenger, because he feels great once he does receive the [place]. He is taking xyzal  5 mg twice daily, flonase 2 SEN daily and azelastine 2 SEN daily.  He tolerates  Xyzal better than he did cetirizine.  He is still taking singulair daily.  He is using OTC allergy eye drops.  He is also taking prilosec 20 mg twice daily as needed which helps control his reflux. If he stops taking this, he will start having severe reflux symptoms. He did have a recent colonoscopy that was normal. He does not want to see GI for reflux at this time.    All medications reviewed by clinical staff and updated in chart. No new pertinent medical or surgical history except as noted in HPI.  ROS: All others negative except as noted per HPI.   Objective:  BP 128/84   Pulse 88   Temp 98.8 F (37.1 C)   Resp 18   Ht 5\' 5"  (1.651 m)   Wt 156 lb 8 oz (71 kg)   SpO2 96%   BMI 26.04 kg/m  Body mass index is 26.04 kg/m. Physical Exam: General Appearance:  Alert, cooperative, no distress, appears stated age  Head:  Normocephalic, without obvious abnormality, atraumatic  Eyes:  Conjunctiva clear, EOM's intact  Ears EACs normal bilaterally and normal TMs bilaterally  Nose: Nares normal, hypertrophic turbinates, normal mucosa, and no visible anterior polyps  Throat: Lips, tongue normal; teeth and gums normal, normal posterior oropharynx  Neck: Supple, symmetrical  Lungs:   clear to auscultation bilaterally, Respirations unlabored, no coughing  Heart:  regular rate and rhythm and no murmur, Appears well perfused  Extremities: No edema  Skin: Skin color, texture, turgor normal and no rashes or lesions on visualized portions of skin  Neurologic: No gross deficits   Labs:  Lab Orders  No laboratory test(s)  ordered today    Spirometry:  Tracings reviewed. His effort: Good reproducible efforts. FVC: 3.60L FEV1: 3.24L, 103% predicted FEV1/FVC ratio: 0.90 Interpretation: Spirometry consistent with normal pattern.  Please see scanned spirometry results for details.  Assessment/Plan   Asthma-at goal, on fasenra Continue montelukast 10 mg once a day to prevent cough or  wheeze Continue Symbicort 160 mcg-2 puffs twice a day to prevent cough or wheeze-this will replace Symbicort (switching due to insurance coverage) Continue Spiriva 1.25 mcg - 2 puffs once a day to prevent cough or wheeze Airsupra 2 puffs every 4 hours as needed for cough or wheeze (maiximum 12 puffs/day) OR Instead use albuterol 0.083% solution via nebulizer one unit vial every 4 hours as needed for cough or wheeze You may use Airsupra 2 puffs 5 to 15 minutes before activity to decrease cough or wheeze Continue Fasenra 30 mg injections once every 8 weeks and have access to an epinephrine auto-injector  Allergic rhinitis-at goal Continue xyzal 5mg  twice a day as needed for runny nose or itch Continue Flonase 2 sprays in each nostril once a day as needed for stuffy nose. In the right nostril, point the applicator out toward the right ear. In the left nostril, point the applicator out toward the left ear Consider saline nasal rinses as needed for nasal symptoms. Use this before any medicated nasal sprays for best result Continue azelastine 2 sprays in each nostril twice a day as needed for runny nose Consider updating your environmental allergy testing if symptoms worsen.  Allergic conjunctivitis-at goal Will send bepreve to see if covered-1 drop each eye twice daily as needed. If not covered, use pataday or zaditor. Recommend use of a lubricant eyedrop as needed Recommend eye exam  Reflux-at goal Continue dietary lifestyle modifications as listed below Continue omeprazole 20 mg twice a day to prevent reflux.  Take this medication 30 minutes before your first meal. Consider GI referral   Call the clinic if this treatment plan is not working well for you  Follow up in 6 months or sooner if needed.  It was a pleasure seeing you again in clinic today! Thank you for allowing me to participate in your care.  Other: none  Tonny Bollman, MD  [facility]

## 2023-04-13 ENCOUNTER — Ambulatory Visit: Payer: BC Managed Care – PPO | Admitting: Internal Medicine

## 2023-05-03 ENCOUNTER — Other Ambulatory Visit: Payer: Self-pay | Admitting: Internal Medicine

## 2023-05-18 ENCOUNTER — Other Ambulatory Visit: Payer: Self-pay | Admitting: Family Medicine

## 2023-05-19 ENCOUNTER — Other Ambulatory Visit: Payer: Self-pay

## 2023-05-19 MED ORDER — BUDESONIDE-FORMOTEROL FUMARATE 160-4.5 MCG/ACT IN AERO: INHALATION_SPRAY | RESPIRATORY_TRACT | 0 refills | Status: DC

## 2023-05-25 ENCOUNTER — Ambulatory Visit: Payer: BC Managed Care – PPO | Admitting: *Deleted

## 2023-05-25 DIAGNOSIS — J4550 Severe persistent asthma, uncomplicated: Secondary | ICD-10-CM

## 2023-06-13 DIAGNOSIS — R5383 Other fatigue: Secondary | ICD-10-CM | POA: Diagnosis not present

## 2023-06-13 DIAGNOSIS — I10 Essential (primary) hypertension: Secondary | ICD-10-CM | POA: Diagnosis not present

## 2023-06-13 DIAGNOSIS — M25511 Pain in right shoulder: Secondary | ICD-10-CM | POA: Diagnosis not present

## 2023-06-13 DIAGNOSIS — Z131 Encounter for screening for diabetes mellitus: Secondary | ICD-10-CM | POA: Diagnosis not present

## 2023-06-13 DIAGNOSIS — Z125 Encounter for screening for malignant neoplasm of prostate: Secondary | ICD-10-CM | POA: Diagnosis not present

## 2023-06-13 DIAGNOSIS — Z6826 Body mass index (BMI) 26.0-26.9, adult: Secondary | ICD-10-CM | POA: Diagnosis not present

## 2023-06-13 DIAGNOSIS — Z0000 Encounter for general adult medical examination without abnormal findings: Secondary | ICD-10-CM | POA: Diagnosis not present

## 2023-06-13 DIAGNOSIS — E7800 Pure hypercholesterolemia, unspecified: Secondary | ICD-10-CM | POA: Diagnosis not present

## 2023-07-12 ENCOUNTER — Other Ambulatory Visit: Payer: Self-pay | Admitting: Internal Medicine

## 2023-07-20 ENCOUNTER — Ambulatory Visit: Payer: BC Managed Care – PPO

## 2023-07-20 DIAGNOSIS — J4550 Severe persistent asthma, uncomplicated: Secondary | ICD-10-CM | POA: Diagnosis not present

## 2023-08-01 ENCOUNTER — Telehealth: Payer: Self-pay | Admitting: Internal Medicine

## 2023-08-01 MED ORDER — SPIRIVA RESPIMAT 1.25 MCG/ACT IN AERS: 2.0000 | INHALATION_SPRAY | Freq: Every day | RESPIRATORY_TRACT | 0 refills | Status: DC

## 2023-08-01 NOTE — Telephone Encounter (Signed)
Pt request a refill for Spiriva, he is out and it will take 2 wks for him to get it through mail order, he is requesting a refill be sent to [facility] on [address]

## 2023-08-01 NOTE — Telephone Encounter (Signed)
Refill sent to requested pharmacy. Pt has appointment already scheduled for 10/03/23.

## 2023-08-02 ENCOUNTER — Other Ambulatory Visit: Payer: Self-pay | Admitting: Allergy and Immunology

## 2023-08-15 ENCOUNTER — Other Ambulatory Visit: Payer: Self-pay | Admitting: *Deleted

## 2023-08-15 MED ORDER — BUDESONIDE-FORMOTEROL FUMARATE 160-4.5 MCG/ACT IN AERO: INHALATION_SPRAY | RESPIRATORY_TRACT | 0 refills | Status: DC

## 2023-09-14 ENCOUNTER — Ambulatory Visit (INDEPENDENT_AMBULATORY_CARE_PROVIDER_SITE_OTHER): Payer: BC Managed Care – PPO

## 2023-09-14 DIAGNOSIS — J4550 Severe persistent asthma, uncomplicated: Secondary | ICD-10-CM | POA: Diagnosis not present

## 2023-09-30 NOTE — Progress Notes (Signed)
 FOLLOW UP Date of Service/Encounter:  10/03/23  Subjective:  Preston Taylor (DOB: Nov 26, 1965) is a 58 y.o. male who [facility] in re-evaluation of the following: Asthma controlled on Fasenra, allergic rhinitis and reflux History obtained from: chart review and patient and daughter .  For Review, LV was on 04/04/23  with Dr.Ercel Pepitone seen for routine follow-up. See below for summary of history and diagnostics.   Therapeutic plans/changes recommended: FEV1 103%, continued on current plan ----------------------------------------------------- Pertinent History/Diagnostics:  - Asthma: Harrington Challenger is helping, also taking Symbicort 160 2 puffs BID, spiriva, singulair He has tried and failed Xolair.  He was switched to [place] in 2018. - Allergic Rhinitis: itchy/dry eyes. No recent eye exam. Dry eyes less bothersome when not taking AH BID. Not interested in AIT. 03/1923: tolerates xyzal better than zyrtec.                - SPT environmental panel -2014 positive to molds - GERD: controlled on PPI (omeprazole 20 mg BID), unable to wean off  --------------------------------------------------- Today presents for follow-up. Discussed the use of AI scribe software for clinical note transcription with the patient, who gave verbal consent to proceed.  History of Present Illness   Preston Taylor is a 58 year old male with asthma who presents with worsening breathing difficulties due to lack of Symbicort.  He has been experiencing worsening breathing difficulties over the past seven days, coinciding with the period he has been without Symbicort due to cost issues. The cost of Symbicort is too high, making it unaffordable for him.  He cannot afford Spiriva from [facility] and instead uses a $35 coupon at [employer]. He is currently using regular albuterol as his rescue inhaler because Paulene Floor is not covered by his insurance. He ran out of albuterol and has been unable to  exercise due to this.  He is currently taking Harrington Challenger, which he reports as being effective and helping him maintain his daily activities. He also takes Singulair (montelukast) every night and confirms he is receiving it. Additionally, he takes Prilosec daily for reflux, which he states is controlled when he takes the medication, and uses azelastine nasal spray to help with allergic rhinitis.  No need for steroids or antibiotics since his last visit, but he feels he might need them now due to tightness in his chest and hearing wheezing, especially at night, although it is not yet keeping him up at night.      Chart Review: Last fasenra injection 09/14/23  All medications reviewed by clinical staff and updated in chart. No new pertinent medical or surgical history except as noted in HPI.  ROS: All others negative except as noted per HPI.   Objective:  BP 122/68 (BP Location: Right Arm, Patient Position: Sitting, Cuff Size: Normal)   Pulse 88   Temp 98.8 F (37.1 C) (Temporal)   Resp 19   Ht 5' 4.75" (1.645 m)   Wt 160 lb 11.2 oz (72.9 kg)   SpO2 96%   BMI 26.95 kg/m  Body mass index is 26.95 kg/m. Physical Exam: General Appearance:  Alert, cooperative, no distress, appears stated age  Head:  Normocephalic, without obvious abnormality, atraumatic  Eyes:  Conjunctiva clear, EOM's intact  Ears EACs normal bilaterally and normal TMs bilaterally  Nose: Nares normal, hypertrophic turbinates, normal mucosa, and no visible anterior polyps  Throat: Lips, tongue normal; teeth and gums normal, normal posterior oropharynx  Neck: Supple, symmetrical  Lungs:  clear to auscultation bilaterally, Respirations unlabored, no coughing  Heart:  regular rate and rhythm and no murmur, Appears well perfused  Extremities: No edema  Skin: Skin color, texture, turgor normal and no rashes or lesions on visualized portions of skin  Neurologic: No gross deficits   Labs:  Lab Orders  No laboratory  test(s) ordered today    Spirometry:  Tracings reviewed. His effort: Good reproducible efforts. FVC: 3.41L FEV1: 3.14L, 100% predicted FEV1/FVC ratio: 0.92 Interpretation: Spirometry consistent with normal pattern.  Please see scanned spirometry results for details.  Assessment/Plan   Asthma with exacerbation. Out of symbicort for the past week with increased symptoms. Continue montelukast 10 mg once a day to prevent cough or wheeze Start Breztri 160 mcg 2 puffs twice daily with spacer. Rinse mouth after use. Airsupra 2 puffs every 4 hours as needed for cough or wheeze (maiximum 12 puffs/day) OR Instead use albuterol 0.083% solution via nebulizer one unit vial every 4 hours as needed for cough or wheeze You may use Airsupra 2 puffs 5 to 15 minutes before activity to decrease cough or wheeze Continue Fasenra 30 mg injections once every 8 weeks and have access to an epinephrine auto-injector 40 mg depomedrol given in clinic today  Allergic rhinitis-at goal Continue xyzal 5mg  twice a day as needed for runny nose or itch Continue Flonase 2 sprays in each nostril once a day as needed for stuffy nose. In the right nostril, point the applicator out toward the right ear. In the left nostril, point the applicator out toward the left ear Consider saline nasal rinses as needed for nasal symptoms. Use this before any medicated nasal sprays for best result Continue azelastine 2 sprays in each nostril twice a day as needed for runny nose Consider updating your environmental allergy testing if symptoms worsen.  Allergic conjunctivitis-at goal Bepreve-1 drop each eye twice daily as needed. If not covered, use pataday or zaditor. Recommend use of a lubricant eyedrop as needed Recommend eye exam  Reflux-at goal on daily omeprazole Continue dietary lifestyle modifications as listed below Continue omeprazole 20 mg twice a day to prevent reflux.  Take this medication 30 minutes before your first  meal. Consider GI referral   Call the clinic if this treatment plan is not working well for you  Follow up in 6 months or sooner if needed.  It was a pleasure seeing you again in clinic today! Thank you for allowing me to participate in your care.  Tonny Bollman, MD [facility]  Other: none  Tonny Bollman, MD  [facility]

## 2023-10-02 ENCOUNTER — Other Ambulatory Visit: Payer: Self-pay | Admitting: Internal Medicine

## 2023-10-03 ENCOUNTER — Encounter: Payer: Self-pay | Admitting: Internal Medicine

## 2023-10-03 ENCOUNTER — Ambulatory Visit (INDEPENDENT_AMBULATORY_CARE_PROVIDER_SITE_OTHER): Payer: BC Managed Care – PPO | Admitting: Internal Medicine

## 2023-10-03 ENCOUNTER — Other Ambulatory Visit: Payer: Self-pay

## 2023-10-03 VITALS — BP 122/68 | HR 88 | Temp 98.8°F | Resp 19 | Ht 64.75 in | Wt 160.7 lb

## 2023-10-03 DIAGNOSIS — J4551 Severe persistent asthma with (acute) exacerbation: Secondary | ICD-10-CM

## 2023-10-03 DIAGNOSIS — K219 Gastro-esophageal reflux disease without esophagitis: Secondary | ICD-10-CM

## 2023-10-03 DIAGNOSIS — H1013 Acute atopic conjunctivitis, bilateral: Secondary | ICD-10-CM

## 2023-10-03 DIAGNOSIS — J3089 Other allergic rhinitis: Secondary | ICD-10-CM

## 2023-10-03 MED ORDER — ALBUTEROL SULFATE HFA 108 (90 BASE) MCG/ACT IN AERS: 2.0000 | INHALATION_SPRAY | Freq: Four times a day (QID) | RESPIRATORY_TRACT | 3 refills | Status: AC | PRN

## 2023-10-03 MED ORDER — AIRSUPRA 90-80 MCG/ACT IN AERO
2.0000 | INHALATION_SPRAY | RESPIRATORY_TRACT | 1 refills | Status: DC | PRN
Start: 2023-10-03 — End: 2024-03-26

## 2023-10-03 MED ORDER — BREZTRI AEROSPHERE 160-9-4.8 MCG/ACT IN AERO: 2.0000 | INHALATION_SPRAY | Freq: Two times a day (BID) | RESPIRATORY_TRACT | 5 refills | Status: DC

## 2023-10-03 MED ORDER — ALBUTEROL SULFATE (2.5 MG/3ML) 0.083% IN NEBU: INHALATION_SOLUTION | RESPIRATORY_TRACT | 10 refills | Status: DC

## 2023-10-03 MED ORDER — MONTELUKAST SODIUM 10 MG PO TABS: 10.0000 mg | ORAL_TABLET | Freq: Every day | ORAL | 1 refills | Status: DC

## 2023-10-03 MED ORDER — AZELASTINE HCL 0.1 % NA SOLN: 1.0000 | Freq: Two times a day (BID) | NASAL | 1 refills | Status: DC | PRN

## 2023-10-03 MED ORDER — BEPOTASTINE BESILATE 1.5 % OP SOLN
1.0000 [drp] | Freq: Two times a day (BID) | OPHTHALMIC | 5 refills | Status: DC | PRN
Start: 2023-10-03 — End: 2024-03-26

## 2023-10-03 MED ORDER — METHYLPREDNISOLONE ACETATE 40 MG/ML IJ SUSP
40.0000 mg | Freq: Once | INTRAMUSCULAR | Status: AC
  Administered 2023-10-03: 40 mg via INTRAMUSCULAR

## 2023-10-03 MED ORDER — OMEPRAZOLE 20 MG PO CPDR: 20.0000 mg | DELAYED_RELEASE_CAPSULE | Freq: Every day | ORAL | 1 refills | Status: DC

## 2023-10-03 NOTE — Patient Instructions (Addendum)
 Asthma with exacerbation. Out of symbicort for the past week with increased symptoms. Continue montelukast 10 mg once a day to prevent cough or wheeze Start Breztri 160 mcg 2 puffs twice daily with spacer. Rinse mouth after use. Airsupra 2 puffs every 4 hours as needed for cough or wheeze (maiximum 12 puffs/day) OR Instead use albuterol 0.083% solution via nebulizer one unit vial every 4 hours as needed for cough or wheeze You may use Airsupra 2 puffs 5 to 15 minutes before activity to decrease cough or wheeze Continue Fasenra 30 mg injections once every 8 weeks and have access to an epinephrine auto-injector 40 mg depomedrol given in clinic today  Allergic rhinitis Continue xyzal 5mg  twice a day as needed for runny nose or itch Continue Flonase 2 sprays in each nostril once a day as needed for stuffy nose. In the right nostril, point the applicator out toward the right ear. In the left nostril, point the applicator out toward the left ear Consider saline nasal rinses as needed for nasal symptoms. Use this before any medicated nasal sprays for best result Continue azelastine 2 sprays in each nostril twice a day as needed for runny nose Consider updating your environmental allergy testing if symptoms worsen.  Allergic conjunctivitis Bepreve-1 drop each eye twice daily as needed. If not covered, use pataday or zaditor. Recommend use of a lubricant eyedrop as needed Recommend eye exam  Reflux Continue dietary lifestyle modifications as listed below Continue omeprazole 20 mg twice a day to prevent reflux.  Take this medication 30 minutes before your first meal. Consider GI referral   Call the clinic if this treatment plan is not working well for you  Follow up in 6 months or sooner if needed.  It was a pleasure seeing you again in clinic today! Thank you for allowing me to participate in your care.  Tonny Bollman, MD [facility]    Lifestyle Changes for Controlling  GERD When you have GERD, stomach acid feels as if it's backing up toward your mouth. Whether or not you take medication to control your GERD, your symptoms can often be improved with lifestyle changes.   Raise Your Head Reflux is more likely to strike when you're lying down flat, because stomach fluid can flow backward more easily. Raising the head of your bed 4-6 inches can help. To do this: Slide blocks or books under the legs at the head of your bed. Or, place a wedge under the mattress. Many foam stores can make a suitable wedge for you. The wedge should run from your waist to the top of your head. Don't just prop your head on several pillows. This increases pressure on your stomach. It can make GERD worse.  Watch Your Eating Habits Certain foods may increase the acid in your stomach or relax the lower esophageal sphincter, making GERD more likely. It's best to avoid the following: Coffee, tea, and carbonated drinks (with and without caffeine) Fatty, fried, or spicy food Mint, chocolate, onions, and tomatoes Any other foods that seem to irritate your stomach or cause you pain  Relieve the Pressure Eat smaller meals, even if you have to eat more often. Don't lie down right after you eat. Wait a few hours for your stomach to empty. Avoid tight belts and tight-fitting clothes. Lose excess weight.  Tobacco and Alcohol Avoid smoking tobacco and drinking alcohol. They can make GERD symptoms worse.

## 2023-10-06 ENCOUNTER — Other Ambulatory Visit: Payer: Self-pay

## 2023-10-06 ENCOUNTER — Telehealth: Payer: Self-pay | Admitting: Allergy

## 2023-10-06 MED ORDER — PREDNISONE 10 MG PO TABS: ORAL_TABLET | ORAL | 0 refills | Status: DC

## 2023-10-06 MED ORDER — SPIRIVA RESPIMAT 1.25 MCG/ACT IN AERS
2.0000 | INHALATION_SPRAY | Freq: Every day | RESPIRATORY_TRACT | 3 refills | Status: DC
Start: 2023-10-06 — End: 2024-01-03

## 2023-10-06 MED ORDER — BUDESONIDE-FORMOTEROL FUMARATE 160-4.5 MCG/ACT IN AERO: 2.0000 | INHALATION_SPRAY | Freq: Two times a day (BID) | RESPIRATORY_TRACT | 3 refills | Status: DC

## 2023-10-06 NOTE — Telephone Encounter (Signed)
 I called the patient to check on his asthma symptoms and left a message that we will call again 10/07/23 to check on how he is doing. I left the [place] number for the patient to call with any questions or concerns.

## 2023-10-06 NOTE — Telephone Encounter (Signed)
 Patient called stating that his asthma is not doing better.  He used Clinical cytogeneticist and doesn't think it works as well as his Symbicort + Spiriva that he was on and requesting refill.  He had trouble breathing last night with chest tightness, wheezing and coughing.  He used the albuterol nebulizer with no benefit.  Asked him why he didn't go to the [facility] and states that it's too expensive.  I sent in Rx for Symbicort and Spiriva. Also sent in prednisone taper.  Advised him to use albuterol nebulizer every 4 hours.  Also told him that if he feels like that again overnight he should go to the ER/UC to be evaluated.  Patient was speaking in full sentences over the phone. I asked patient come in for an office visit on Friday. I would have asked today but due to weather conditions our offices are closed for now until noon and maybe closed for the rest of the day.

## 2023-10-07 NOTE — Telephone Encounter (Signed)
 I called and spoke with the patient and he started taking the old medication yesterday and stated that he is feeling better. He stated he is feeling more stable on the old medication. I did advise to call back if he needs anything. Patient verbalized understanding.

## 2023-10-10 ENCOUNTER — Ambulatory Visit
Admission: EM | Admit: 2023-10-10 | Discharge: 2023-10-10 | Disposition: A | Discharge status: Home / Self Care | Payer: BC Managed Care – PPO | Attending: Family Medicine | Admitting: Family Medicine

## 2023-10-10 DIAGNOSIS — B349 Viral infection, unspecified: Secondary | ICD-10-CM | POA: Diagnosis not present

## 2023-10-10 LAB — POC COVID19/FLU A&B COMBO
Covid Antigen, POC: NEGATIVE
Influenza A Antigen, POC: NEGATIVE
Influenza B Antigen, POC: NEGATIVE

## 2023-10-10 MED ORDER — BENZONATATE 200 MG PO CAPS: 200.0000 mg | ORAL_CAPSULE | Freq: Three times a day (TID) | ORAL | 0 refills | Status: DC | PRN

## 2023-10-10 NOTE — ED Provider Notes (Signed)
 [facility]    CSN: [id number] Arrival date & time: 10/10/23  3875      History   Chief Complaint Chief Complaint  Patient presents with   Nasal Congestion   Fever   Sore Throat   Headache    HPI Preston Taylor is a 58 y.o. male  presents for evaluation of URI symptoms for 4 days. Patient reports associated symptoms of headache, cough, congestion, sore throat, tactile fevers. Denies N/V/D, ear pain, body aches. Patient does have a hx of asthma.  Has been using his inhaler nebulizer.  Patient not not an active smoker.   Reports no sick contacts.  Pt has taken TheraFlu OTC for symptoms.  He states that TheraFlu seems to make his asthma symptoms worse.  Last time he took it was last night.  Pt has no other concerns at this time.    Fever Associated symptoms: congestion, cough, headaches and sore throat   Sore Throat Associated symptoms include headaches.  Headache Associated symptoms: congestion, cough, fever and sore throat     Past Medical History:  Diagnosis Date   Allergic rhinitis    Asthma     Patient Active Problem List   Diagnosis Date Noted   ED (erectile dysfunction) 03/15/2022   Hyperlipidemia 03/15/2022   Allergic conjunctivitis of both eyes 06/22/2021   Moderate persistent asthma without complication 10/19/2017   Severe persistent asthma without complication 04/18/2015   Other allergic rhinitis 04/18/2015   Gastroesophageal reflux disease 04/18/2015   Seasonal allergic conjunctivitis 04/18/2015   Essential (primary) hypertension 06/25/2011    History reviewed. No pertinent surgical history.     Home Medications    Prior to Admission medications   Medication Sig Start Date End Date Taking? Authorizing Provider  benzonatate (TESSALON) 200 MG capsule Take 1 capsule (200 mg total) by mouth 3 (three) times daily as needed for cough. 10/10/23  Yes Radford Pax, NP  albuterol (PROVENTIL) (2.5 MG/3ML) 0.083% nebulizer solution USE 1 VIAL IN THE  NEBULIZER EVERY 4 TO 6 HOURS IF NEEDED FOR COUGH OR WHEEZE 10/03/23   Verlee Monte, MD  albuterol (VENTOLIN HFA) 108 (90 Base) MCG/ACT inhaler Inhale 2 puffs into the lungs every 6 (six) hours as needed for wheezing or shortness of breath. 10/03/23   Verlee Monte, MD  Albuterol-Budesonide [facility]) 90-80 MCG/ACT AERO Inhale 2 Inhalations into the lungs as needed. 12 puffs/daily maximum 10/03/23   Verlee Monte, MD  amLODipine (NORVASC) 10 MG tablet  08/14/16   [provider]  azelastine (ASTELIN) 0.1 % nasal spray Place 1-2 sprays into both nostrils 2 (two) times daily as needed (nasal drainage). 10/03/23   Verlee Monte, MD  Bepotastine Besilate 1.5 % SOLN Place 1 drop into both eyes 2 (two) times daily as needed. 10/03/23   Verlee Monte, MD  budesonide-formoterol [facility]) 160-4.5 MCG/ACT inhaler Inhale 2 puffs into the lungs in the morning and at bedtime. with spacer and rinse mouth afterwards. 10/06/23   Ellamae Sia, DO  cetirizine (ZYRTEC) 10 MG tablet TAKE 1 TABLET DAILY 07/13/23   Verlee Monte, MD  [facility] 30 MG/ML prefilled syringe INJECT THE CONTENTS OF 1 SYRINGE (30 MG) UNDER THE SKIN EVERY 8 WEEKS 08/02/23   Kozlow, Alvira Philips, MD  fluticasone (FLONASE) 50 MCG/ACT nasal spray USE 1 SPRAY IN EACH NOSTRIL TWICE A DAY AS NEEDED FOR NASAL CONGESTION 04/04/23   Verlee Monte, MD  ipratropium-albuterol (DUONEB) 0.5-2.5 (3) MG/3ML SOLN Use one vial  in the nebulizer every 4-6 hours if needed for cough or wheeze. 09/08/21   Ellamae Sia, DO  losartan (COZAAR) 25 MG tablet  08/02/18   [provider]  montelukast (SINGULAIR) 10 MG tablet Take 1 tablet (10 mg total) by mouth daily. 10/03/23   Verlee Monte, MD  naproxen (NAPROSYN) 500 MG tablet Take by mouth. 10/11/22   [provider]  omalizumab [facility] Paradise) 150 MG injection Inject 150 mg into the skin. 07/17/15   [provider]  omeprazole (PRILOSEC OTC) 20 MG tablet Take 1 tablet (20 mg total) by mouth in the  morning and at bedtime. 04/04/23   Verlee Monte, MD  omeprazole (PRILOSEC) 20 MG capsule Take 1 capsule (20 mg total) by mouth daily. 10/03/23   Verlee Monte, MD  predniSONE (DELTASONE) 10 MG tablet Take prednisone 40mg  daily x 2 days, 30mg  daily x 2 days, 20mg  daily x 2 days and 10mg  daily x 2 days. 10/06/23   Ellamae Sia, DO  simvastatin (ZOCOR) 10 MG tablet  07/27/16   [provider]  Tiotropium Bromide Monohydrate (SPIRIVA RESPIMAT) 1.25 MCG/ACT AERS Inhale 2 puffs into the lungs daily. 10/06/23   Ellamae Sia, DO    Family History Family History  Problem Relation Age of Onset   Liver cancer Brother    Allergic rhinitis Neg Hx    Asthma Neg Hx     Social History Social History   Tobacco Use   Smoking status: Never   Smokeless tobacco: Never  Vaping Use   Vaping status: Never Used  Substance Use Topics   Alcohol use: Never   Drug use: Never     Allergies   Lisinopril   Review of Systems Review of Systems  Constitutional:  Positive for fever.  HENT:  Positive for congestion and sore throat.   Respiratory:  Positive for cough.   Neurological:  Positive for headaches.     Physical Exam Triage Vital Signs ED Triage Vitals [10/10/23 1251]  Encounter Vitals Group     BP (!) 167/102     Systolic BP Percentile      Diastolic BP Percentile      Pulse Rate 93     Resp 18     Temp 97.8 F (36.6 C)     Temp Source Oral     SpO2 96 %     Weight      Height      Head Circumference      Peak Flow      Pain Score      Pain Loc      Pain Education      Exclude from Growth Chart    No data found.  Updated Vital Signs BP (!) 167/102 (BP Location: Right Arm)   Pulse 93   Temp 97.8 F (36.6 C) (Oral)   Resp 18   SpO2 96%   Visual Acuity Right Eye Distance:   Left Eye Distance:   Bilateral Distance:    Right Eye Near:   Left Eye Near:    Bilateral Near:     Physical Exam Vitals and nursing note reviewed.  Constitutional:      General: He is  not in acute distress.    Appearance: Normal appearance. He is not ill-appearing or toxic-appearing.  HENT:     Head: Normocephalic and atraumatic.     Right Ear: Tympanic membrane and ear canal normal.     Left Ear: Tympanic membrane  and ear canal normal.     Nose: Congestion present.     Mouth/Throat:     Mouth: Mucous membranes are moist.     Pharynx: Posterior oropharyngeal erythema present.  Eyes:     Pupils: Pupils are equal, round, and reactive to light.  Cardiovascular:     Rate and Rhythm: Normal rate and regular rhythm.     Heart sounds: Normal heart sounds.  Pulmonary:     Effort: Pulmonary effort is normal.     Breath sounds: Normal breath sounds. No wheezing or rhonchi.  Musculoskeletal:     Cervical back: Normal range of motion and neck supple.  Lymphadenopathy:     Cervical: No cervical adenopathy.  Skin:    General: Skin is warm and dry.  Neurological:     General: No focal deficit present.     Mental Status: He is alert and oriented to person, place, and time.  Psychiatric:        Mood and Affect: Mood normal.        Behavior: Behavior normal.      UC Treatments / Results  Labs (all labs ordered are listed, but only abnormal results are displayed) Labs Reviewed  POC COVID19/FLU A&B COMBO - Normal    EKG   Radiology No results found.  Procedures Procedures (including critical care time)  Medications Ordered in UC Medications - No data to display  Initial Impression / Assessment and Plan / UC Course  I have reviewed the triage vital signs and the nursing notes.  Pertinent labs & imaging results that were available during my care of the patient were reviewed by me and considered in my medical decision making (see chart for details).     Reviewed exam and symptoms with patient.  No red flags.  Negative rapid flu and COVID testing.  Discussed viral illness and symptomatic treatment.  Advised him to stop TheraFlu as this seems to flare his  asthma.  Will do trial of Tessalon.  He will continue his inhalers and nebulizers as needed.  He states he is already on his prednisone taper from his allergist.  PCP follow-up if symptoms do not improve.  ER precautions reviewed. Final Clinical Impressions(s) / UC Diagnoses   Final diagnoses:  Viral illness     Discharge Instructions      Start Tessalon 3 times a day as needed for your cough.  Do not take any additional over-the-counter TheraFlu as this is bothering your asthma.  Continue inhaler nebulizer as needed.  Lots of fluids and rest.  Follow-up with your PCP if your symptoms do not improve.  Please go to the ER for any worsening symptoms.  Hope you feel better soon!     ED Prescriptions     Medication Sig Dispense Auth. Provider   benzonatate (TESSALON) 200 MG capsule Take 1 capsule (200 mg total) by mouth 3 (three) times daily as needed for cough. 20 capsule Radford Pax, NP      PDMP not reviewed this encounter.   Radford Pax, NP 10/10/23 [phone]

## 2023-10-10 NOTE — Discharge Instructions (Signed)
 Start Tessalon 3 times a day as needed for your cough.  Do not take any additional over-the-counter TheraFlu as this is bothering your asthma.  Continue inhaler nebulizer as needed.  Lots of fluids and rest.  Follow-up with your PCP if your symptoms do not improve.  Please go to the ER for any worsening symptoms.  Hope you feel better soon!

## 2023-10-10 NOTE — ED Triage Notes (Signed)
 Patient presents to UC for HA, fever, sore throat, and nasal congestion x 4 days. Treating symptoms with Theraflu, asthma inhaler. Hx of asthma. States he came in for eval since the OTC cough meds affect his asthma.

## 2023-10-26 ENCOUNTER — Other Ambulatory Visit: Payer: Self-pay | Admitting: Internal Medicine

## 2023-10-31 ENCOUNTER — Other Ambulatory Visit: Payer: Self-pay | Admitting: Internal Medicine

## 2023-11-09 ENCOUNTER — Ambulatory Visit: Payer: BC Managed Care – PPO | Admitting: *Deleted

## 2023-11-09 DIAGNOSIS — J4550 Severe persistent asthma, uncomplicated: Secondary | ICD-10-CM | POA: Diagnosis not present

## 2023-12-13 DIAGNOSIS — I10 Essential (primary) hypertension: Secondary | ICD-10-CM | POA: Diagnosis not present

## 2023-12-13 DIAGNOSIS — J309 Allergic rhinitis, unspecified: Secondary | ICD-10-CM | POA: Diagnosis not present

## 2023-12-13 DIAGNOSIS — E7849 Other hyperlipidemia: Secondary | ICD-10-CM | POA: Diagnosis not present

## 2023-12-13 DIAGNOSIS — J45998 Other asthma: Secondary | ICD-10-CM | POA: Diagnosis not present

## 2024-01-03 ENCOUNTER — Other Ambulatory Visit: Payer: Self-pay

## 2024-01-03 ENCOUNTER — Other Ambulatory Visit: Payer: Self-pay | Admitting: Family Medicine

## 2024-01-03 MED ORDER — SPIRIVA RESPIMAT 1.25 MCG/ACT IN AERS: 2.0000 | INHALATION_SPRAY | Freq: Every day | RESPIRATORY_TRACT | 3 refills | Status: DC

## 2024-01-04 ENCOUNTER — Ambulatory Visit (INDEPENDENT_AMBULATORY_CARE_PROVIDER_SITE_OTHER)

## 2024-01-04 DIAGNOSIS — J4550 Severe persistent asthma, uncomplicated: Secondary | ICD-10-CM

## 2024-02-02 ENCOUNTER — Ambulatory Visit: Admitting: General Practice

## 2024-02-02 ENCOUNTER — Encounter: Payer: Self-pay | Admitting: General Practice

## 2024-02-02 ENCOUNTER — Ambulatory Visit: Payer: Self-pay | Admitting: General Practice

## 2024-02-02 VITALS — BP 130/86 | HR 86 | Temp 98.1°F | Ht 66.0 in | Wt 160.0 lb

## 2024-02-02 DIAGNOSIS — Z7689 Persons encountering health services in other specified circumstances: Secondary | ICD-10-CM | POA: Insufficient documentation

## 2024-02-02 DIAGNOSIS — J4550 Severe persistent asthma, uncomplicated: Secondary | ICD-10-CM | POA: Diagnosis not present

## 2024-02-02 DIAGNOSIS — I10 Essential (primary) hypertension: Secondary | ICD-10-CM | POA: Diagnosis not present

## 2024-02-02 DIAGNOSIS — E785 Hyperlipidemia, unspecified: Secondary | ICD-10-CM

## 2024-02-02 LAB — CBC
HCT: 50.8 % (ref 39.0–52.0)
Hemoglobin: 17.1 g/dL — ABNORMAL HIGH (ref 13.0–17.0)
MCHC: 33.6 g/dL (ref 30.0–36.0)
MCV: 84.3 fl (ref 78.0–100.0)
Platelets: 247 10*3/uL (ref 150.0–400.0)
RBC: 6.03 Mil/uL — ABNORMAL HIGH (ref 4.22–5.81)
RDW: 14 % (ref 11.5–15.5)
WBC: 7.8 10*3/uL (ref 4.0–10.5)

## 2024-02-02 LAB — COMPREHENSIVE METABOLIC PANEL WITH GFR
ALT: 29 U/L (ref 0–53)
AST: 19 U/L (ref 0–37)
Albumin: 4.5 g/dL (ref 3.5–5.2)
Alkaline Phosphatase: 46 U/L (ref 39–117)
BUN: 15 mg/dL (ref 6–23)
CO2: 31 meq/L (ref 19–32)
Calcium: 9.2 mg/dL (ref 8.4–10.5)
Chloride: 101 meq/L (ref 96–112)
Creatinine, Ser: 0.85 mg/dL (ref 0.40–1.50)
GFR: 96.71 mL/min (ref >60.00)
Glucose, Bld: 92 mg/dL (ref 70–99)
Potassium: 4.3 meq/L (ref 3.5–5.1)
Sodium: 136 meq/L (ref 135–145)
Total Bilirubin: 0.5 mg/dL (ref 0.2–1.2)
Total Protein: 7.6 g/dL (ref 6.0–8.3)

## 2024-02-02 LAB — LIPID PANEL
Cholesterol: 151 mg/dL (ref 0–200)
HDL: 32.2 mg/dL — ABNORMAL LOW (ref >39.00)
LDL Cholesterol: 97 mg/dL (ref 0–99)
NonHDL: 118.84
Total CHOL/HDL Ratio: 5
Triglycerides: 110 mg/dL (ref 0.0–149.0)
VLDL: 22 mg/dL (ref 0.0–40.0)

## 2024-02-02 LAB — TSH: TSH: 2.1 u[IU]/mL (ref 0.35–5.50)

## 2024-02-02 LAB — HEMOGLOBIN A1C: Hgb A1c MFr Bld: 5.6 % (ref 4.6–6.5)

## 2024-02-02 MED ORDER — LOSARTAN POTASSIUM 25 MG PO TABS: 25.0000 mg | ORAL_TABLET | Freq: Every day | ORAL | 1 refills | Status: AC

## 2024-02-02 MED ORDER — AMLODIPINE BESYLATE 10 MG PO TABS: 10.0000 mg | ORAL_TABLET | Freq: Every day | ORAL | 1 refills | Status: AC

## 2024-02-02 MED ORDER — SIMVASTATIN 10 MG PO TABS: 10.0000 mg | ORAL_TABLET | Freq: Every day | ORAL | 1 refills | Status: DC

## 2024-02-02 NOTE — Assessment & Plan Note (Signed)
 Controlled.  Followed with allergist.  Reviewed notes in care everywhere.   Continue Albuterol  as needed.  Continue Spiriva  2 puffs daily, Motelukast 10 mg once daily, Fasenra  injections very 8 weeks.

## 2024-02-02 NOTE — Progress Notes (Signed)
 New Patient Office Visit  Subjective    Patient ID: Preston Taylor, male    DOB: [birth year]  Age: 58 y.o. MRN: [id number]  CC:  Chief Complaint  Patient presents with   New Patient (Initial Visit)    Needs medication refilled    HPI Preston Taylor is a 58 y.o. male presents to establish care.  Last PCP/physical/labs: [facility].   HTN: diagnosed many years ago. Currently managed Amlodipine 10 mg once daily, Losartan 25 mg once daily. He checks his blood pressure at home and reports readings in the range of 140s/75-80s. He denies any blurred vision, chest pain, shortness of breath. He would like refill for his medication.   Asthma: diagnosed many years ago. Followed by allergist. Currently managed on Fasenra  30 mg every 8 weeks, Albuterol  as needed, Symbicort  inhaler, Montelukast  10 mg one daily. He gets asthma attack every 3-4 months and he has an action plan. He does need a refill for Montelukast .   HLD: diagnosed many years ago. Currently managed on Simvastatin 10 mg once daily. He needs a refill for his medication. He has been monitoring diet and doing exercise. He denies any chest pain, numbness or tingling.    Outpatient Encounter Medications as of 02/02/2024  Medication Sig   albuterol  (PROVENTIL ) (2.5 MG/3ML) 0.083% nebulizer solution USE 1 VIAL IN THE NEBULIZER EVERY 4 TO 6 HOURS IF NEEDED FOR COUGH OR WHEEZE   albuterol  (VENTOLIN  HFA) 108 (90 Base) MCG/ACT inhaler Inhale 2 puffs into the lungs every 6 (six) hours as needed for wheezing or shortness of breath.   Albuterol -Budesonide  (AIRSUPRA ) 90-80 MCG/ACT AERO Inhale 2 Inhalations into the lungs as needed. 12 puffs/daily maximum   azelastine  (ASTELIN ) 0.1 % nasal spray Place 1-2 sprays into both nostrils 2 (two) times daily as needed (nasal drainage).   Bepotastine  Besilate 1.5 % SOLN Place 1 drop into both eyes 2 (two) times daily as needed.   budesonide -formoterol  (SYMBICORT ) 160-4.5 MCG/ACT inhaler Inhale 2 puffs into the lungs in the  morning and at bedtime. with spacer and rinse mouth afterwards.   cetirizine  (ZYRTEC ) 10 MG tablet TAKE 1 TABLET(10 MG) BY MOUTH DAILY   FASENRA  30 MG/ML prefilled syringe INJECT THE CONTENTS OF 1 SYRINGE (30 MG) UNDER THE SKIN EVERY 8 WEEKS   fluticasone  (FLONASE ) 50 MCG/ACT nasal spray USE 1 SPRAY IN EACH NOSTRIL TWICE A DAY AS NEEDED FOR NASAL CONGESTION   ipratropium-albuterol  (DUONEB) 0.5-2.5 (3) MG/3ML SOLN Use one vial in the nebulizer every 4-6 hours if needed for cough or wheeze.   montelukast  (SINGULAIR ) 10 MG tablet TAKE 1 TABLET DAILY   naproxen (NAPROSYN) 500 MG tablet Take by mouth.   omalizumab  (XOLAIR ) 150 MG injection Inject 150 mg into the skin.   omeprazole  (PRILOSEC  OTC) 20 MG tablet Take 1 tablet (20 mg total) by mouth in the morning and at bedtime.   omeprazole  (PRILOSEC ) 20 MG capsule TAKE 1 CAPSULE DAILY (SHOULD KEEP UPCOMING APPOINTMENT FOR FURTHER REFILLS)   predniSONE  (DELTASONE ) 10 MG tablet Take prednisone  40mg  daily x 2 days, 30mg  daily x 2 days, 20mg  daily x 2 days and 10mg  daily x 2 days.   Tiotropium Bromide  Monohydrate (SPIRIVA  RESPIMAT) 1.25 MCG/ACT AERS Inhale 2 puffs into the lungs daily.   [DISCONTINUED] amLODipine (NORVASC) 10 MG tablet    [DISCONTINUED] losartan (COZAAR) 25 MG tablet    [DISCONTINUED] simvastatin (ZOCOR) 10 MG tablet    amLODipine (NORVASC) 10 MG tablet Take 1 tablet (10 mg total) by mouth daily.  losartan (COZAAR) 25 MG tablet Take 1 tablet (25 mg total) by mouth daily.   simvastatin (ZOCOR) 10 MG tablet Take 1 tablet (10 mg total) by mouth daily at 6 PM.   [DISCONTINUED] benzonatate  (TESSALON ) 200 MG capsule Take 1 capsule (200 mg total) by mouth 3 (three) times daily as needed for cough.   Facility-Administered Encounter Medications as of 02/02/2024  Medication   Benralizumab  SOSY 30 mg    Past Medical History:  Diagnosis Date   Allergic rhinitis    Allergy    current   Arthritis    Daily pain   Asthma    COPD (chronic  obstructive pulmonary disease) (HCC)    current   Hypertension    current   Ulcer    current    History reviewed. No pertinent surgical history.  Family History  Problem Relation Age of Onset   Liver cancer Brother    Allergic rhinitis Neg Hx    Asthma Neg Hx     Social History   Socioeconomic History   Marital status: Married    Spouse name: Not on file   Number of children: Not on file   Years of education: Not on file   Highest education level: Some college, no degree  Occupational History   Not on file  Tobacco Use   Smoking status: Never   Smokeless tobacco: Never   Tobacco comments:    never smoke  Vaping Use   Vaping status: Never Used  Substance and Sexual Activity   Alcohol use: Never   Drug use: Never   Sexual activity: Yes  Other Topics Concern   Not on file  Social History Narrative   Not on file   Social Drivers of Health   Financial Resource Strain: Patient Declined (02/01/2024)   Overall Financial Resource Strain (CARDIA)    Difficulty of Paying Living Expenses: Patient declined  Food Insecurity: Patient Declined (02/01/2024)   Hunger Vital Sign    Worried About Running Out of Food in the Last Year: Patient declined    Ran Out of Food in the Last Year: Patient declined  Transportation Needs: No Transportation Needs (02/01/2024)   PRAPARE - Administrator, Civil Service (Medical): No    Lack of Transportation (Non-Medical): No  Physical Activity: Sufficiently Active (02/01/2024)   Exercise Vital Sign    Days of Exercise per Week: 2 days    Minutes of Exercise per Session: 130 min  Stress: Stress Concern Present (02/01/2024)   [employer] of Occupational Health - Occupational Stress Questionnaire    Feeling of Stress: Very much  Social Connections: Unknown (02/01/2024)   Social Connection and Isolation Panel    Frequency of Communication with Friends and Family: Patient declined    Frequency of Social Gatherings with Friends  and Family: Patient declined    Attends Religious Services: Patient declined    Database administrator or Organizations: No    Attends Engineer, structural: Not on file    Marital Status: Married  Intimate Partner Violence: Unknown (08/27/2022)   Received from [facility]   HITS    Physically Hurt: Not on file    Insult or Talk Down To: Not on file    Threaten Physical Harm: Not on file    Scream or Curse: Not on file    Review of Systems  Constitutional:  Negative for chills and fever.  Respiratory:  Negative for shortness of breath.   Cardiovascular:  Negative  for chest pain and leg swelling.  Gastrointestinal:  Negative for abdominal pain, constipation, diarrhea, heartburn, nausea and vomiting.  Genitourinary:  Negative for dysuria, frequency and urgency.  Neurological:  Negative for dizziness and headaches.  Endo/Heme/Allergies:  Negative for polydipsia.  Psychiatric/Behavioral:  Negative for depression and suicidal ideas. The patient is not nervous/anxious.         Objective    BP 130/86   Pulse 86   Temp 98.1 F (36.7 C) (Oral)   Ht 5' 6 (1.676 m)   Wt 160 lb (72.6 kg)   SpO2 96%   BMI 25.82 kg/m   Physical Exam Vitals and nursing note reviewed.  Constitutional:      Appearance: Normal appearance.   Cardiovascular:     Rate and Rhythm: Normal rate and regular rhythm.     Pulses: Normal pulses.     Heart sounds: Normal heart sounds.  Pulmonary:     Effort: Pulmonary effort is normal.     Breath sounds: Normal breath sounds.   Musculoskeletal:     Right lower leg: No edema.     Left lower leg: No edema.   Neurological:     Mental Status: He is alert and oriented to person, place, and time.   Psychiatric:        Mood and Affect: Mood normal.        Behavior: Behavior normal.        Thought Content: Thought content normal.        Judgment: Judgment normal.         Assessment & Plan:  Essential (primary) hypertension Assessment &  Plan: Bp at goal today.  Discussed checking BP at home and bringing log back.   Continue Amlodipine 10 mg once daily and Losartan 25 mg once daily.  Rx sent.  Labs pending.  Orders: -     amLODIPine Besylate; Take 1 tablet (10 mg total) by mouth daily.  Dispense: 90 tablet; Refill: 1 -     Losartan Potassium; Take 1 tablet (25 mg total) by mouth daily.  Dispense: 90 tablet; Refill: 1 -     Comprehensive metabolic panel with GFR -     CBC -     TSH  Establishing care with new doctor, encounter for Assessment & Plan: EMR reviewed briefly.    Hyperlipidemia, unspecified hyperlipidemia type Assessment & Plan: Lipid panel pending.   Continue simvastatin 10 mg one daily. Refill sent.  Orders: -     Simvastatin; Take 1 tablet (10 mg total) by mouth daily at 6 PM.  Dispense: 90 tablet; Refill: 1 -     Hemoglobin A1c -     Lipid panel  Severe persistent asthma without complication Assessment & Plan: Controlled.  Followed with allergist.  Reviewed notes in care everywhere.   Continue Albuterol  as needed.  Continue Spiriva  2 puffs daily, Motelukast 10 mg once daily, Fasenra  injections very 8 weeks.     Return in about 2 months (around 04/03/2024) for hypertension.   Jolanda Nation, NP

## 2024-02-02 NOTE — Assessment & Plan Note (Signed)
 EMR reviewed briefly.

## 2024-02-02 NOTE — Assessment & Plan Note (Signed)
 Lipid panel pending.   Continue simvastatin 10 mg one daily. Refill sent.

## 2024-02-02 NOTE — Patient Instructions (Signed)
 Stop by the lab prior to leaving today. I will notify you of your results once received.   Start monitoring your blood pressure daily, around the same time of day, for the next 2-3 weeks.  Ensure that you have rested for 30 minutes prior to checking your blood pressure.   Record your readings and notify me if you see numbers consistently at or above 130 on top and/or 90 on bottom.  F/u 6 weeks for hypertension.   Refill sent to the pharmacy.   It was a pleasure to meet you today! Please don't hesitate to contact me with any questions. Welcome to [facility]!

## 2024-02-02 NOTE — Assessment & Plan Note (Signed)
 Bp at goal today.  Discussed checking BP at home and bringing log back.   Continue Amlodipine 10 mg once daily and Losartan 25 mg once daily.  Rx sent.  Labs pending.

## 2024-02-22 ENCOUNTER — Telehealth: Payer: Self-pay | Admitting: Internal Medicine

## 2024-02-22 MED ORDER — BUDESONIDE-FORMOTEROL FUMARATE 160-4.5 MCG/ACT IN AERO: 2.0000 | INHALATION_SPRAY | Freq: Two times a day (BID) | RESPIRATORY_TRACT | 1 refills | Status: DC

## 2024-02-22 NOTE — Telephone Encounter (Signed)
 I called the patient and made him an appointment for August with Dr. Marinda for medication refills. I sent in the Symbicort  inhaler for him to hold him until his appointment.

## 2024-02-22 NOTE — Telephone Encounter (Signed)
 Patient called stating he needs a refill on Symbicort  and Sprivia sent to [facility] on [facility].

## 2024-02-29 ENCOUNTER — Ambulatory Visit

## 2024-02-29 DIAGNOSIS — J4550 Severe persistent asthma, uncomplicated: Secondary | ICD-10-CM | POA: Diagnosis not present

## 2024-03-26 ENCOUNTER — Other Ambulatory Visit: Payer: Self-pay

## 2024-03-26 ENCOUNTER — Ambulatory Visit (INDEPENDENT_AMBULATORY_CARE_PROVIDER_SITE_OTHER): Admitting: Internal Medicine

## 2024-03-26 ENCOUNTER — Encounter: Payer: Self-pay | Admitting: Internal Medicine

## 2024-03-26 VITALS — BP 138/84 | HR 91 | Temp 99.8°F | Resp 16 | Ht 65.5 in | Wt 160.6 lb

## 2024-03-26 DIAGNOSIS — J3089 Other allergic rhinitis: Secondary | ICD-10-CM

## 2024-03-26 DIAGNOSIS — K219 Gastro-esophageal reflux disease without esophagitis: Secondary | ICD-10-CM

## 2024-03-26 DIAGNOSIS — H1013 Acute atopic conjunctivitis, bilateral: Secondary | ICD-10-CM

## 2024-03-26 DIAGNOSIS — J4550 Severe persistent asthma, uncomplicated: Secondary | ICD-10-CM | POA: Diagnosis not present

## 2024-03-26 MED ORDER — OMEPRAZOLE MAGNESIUM 20 MG PO TBEC: 20.0000 mg | DELAYED_RELEASE_TABLET | Freq: Two times a day (BID) | ORAL | 5 refills | Status: AC

## 2024-03-26 MED ORDER — IPRATROPIUM-ALBUTEROL 0.5-2.5 (3) MG/3ML IN SOLN: RESPIRATORY_TRACT | 1 refills | Status: DC

## 2024-03-26 MED ORDER — CETIRIZINE HCL 10 MG PO TABS: 10.0000 mg | ORAL_TABLET | Freq: Every day | ORAL | 3 refills | Status: AC

## 2024-03-26 MED ORDER — MONTELUKAST SODIUM 10 MG PO TABS: 10.0000 mg | ORAL_TABLET | Freq: Every day | ORAL | 3 refills | Status: AC

## 2024-03-26 MED ORDER — ALBUTEROL SULFATE (2.5 MG/3ML) 0.083% IN NEBU: INHALATION_SOLUTION | RESPIRATORY_TRACT | 10 refills | Status: AC

## 2024-03-26 MED ORDER — BEPOTASTINE BESILATE 1.5 % OP SOLN: 1.0000 [drp] | Freq: Two times a day (BID) | OPHTHALMIC | 5 refills | Status: AC | PRN

## 2024-03-26 MED ORDER — AZELASTINE HCL 0.1 % NA SOLN: 1.0000 | Freq: Two times a day (BID) | NASAL | 1 refills | Status: AC | PRN

## 2024-03-26 MED ORDER — FLUTICASONE PROPIONATE 50 MCG/ACT NA SUSP: NASAL | 5 refills | Status: AC

## 2024-03-26 MED ORDER — SPIRIVA RESPIMAT 1.25 MCG/ACT IN AERS: 2.0000 | INHALATION_SPRAY | Freq: Every day | RESPIRATORY_TRACT | 3 refills | Status: AC

## 2024-03-26 MED ORDER — AIRSUPRA 90-80 MCG/ACT IN AERO: 2.0000 | INHALATION_SPRAY | RESPIRATORY_TRACT | 1 refills | Status: DC | PRN

## 2024-03-26 MED ORDER — BUDESONIDE-FORMOTEROL FUMARATE 160-4.5 MCG/ACT IN AERO: 2.0000 | INHALATION_SPRAY | Freq: Two times a day (BID) | RESPIRATORY_TRACT | 1 refills | Status: AC

## 2024-03-26 NOTE — Progress Notes (Signed)
 FOLLOW UP Date of Service/Encounter:   03/26/2024  Subjective:  Preston Taylor (DOB: 01/13/1966) is a 58 y.o. male who [facility] in re-evaluation of the following: asthma, allergic rhinitis, GERD History obtained from: chart review and patient.  For Review, LV was on 10/03/23  with Dr.Aalijah Lanphere seen for routine follow-up. See below for summary of history and diagnostics.   Therapeutic plans/changes recommended: asthma flares as he was out of medication, doing better on fasenra , but needing controller inhaler daily and montelukast .FEV1 100% at that visit. We started him on Breztri .  ----------------------------------------------------- Pertinent History/Diagnostics:  - Asthma: Fasenra  is helping, also taking Symbicort  160 2 puffs BID, spiriva , singulair  He has tried and failed Xolair .  He was switched to Fasenra  in 2018. Failed breztri , does better with symbicort  and spiriva  - Allergic Rhinitis: itchy/dry eyes. No recent eye exam. Dry eyes less bothersome when not taking AH BID. Not interested in AIT. 03/1923: tolerates xyzal  better than zyrtec .                - SPT environmental panel -2014 positive to molds - GERD: controlled on PPI (omeprazole  20 mg BID), unable to wean off  --------------------------------------------------- Today presents for follow-up. Discussed the use of AI scribe software for clinical note transcription with the patient, who gave verbal consent to proceed.  History of Present Illness Preston Taylor is a 58 year old male with asthma who presents with medication management issues.  Asthma symptom exacerbation and medication access - Increased shortness of breath after running out of Symbicort  inhaler two days ago - Previously maintained symptom control with Symbicort  two puffs twice daily and Spiriva  two puffs once daily - Unable to refill Symbicort  due to pharmacy stock shortage - Attempted to use Breztri , found it ineffective, and  experienced an asthma attack while on it - Prefers Airsupra  for rescue use but has had difficulty obtaining refills, only receiving it once despite multiple attempts - Prefers Airsupra  over albuterol  - Sensation of chest tightness currently, no wheezing or coughing - no steroids or ED visits since last visit - continues on fasenra  which he tolerates well and feels asthma better controlled on this medication  Allergic rhinitis and ocular allergy symptoms - Allergic symptoms well-controlled with Xyzal , Flonase , and azelastine  nasal spray - Uses eye drops every morning, unable to recall the brand name - interested in retesting to consider allergy injections  Gastroesophageal reflux symptoms - Continues to experience discomfort from reflux despite taking omeprazole  twice daily - defers referral to GI  Leukotriene receptor antagonist use - Currently taking montelukast      Chart Review: Last fasenra  on 02/29/24  All medications reviewed by clinical staff and updated in chart. No new pertinent medical or surgical history except as noted in HPI.  ROS: All others negative except as noted per HPI.   Objective:  BP 138/84 (BP Location: Left Arm, Patient Position: Sitting, Cuff Size: Normal)   Pulse 91   Temp 99.8 F (37.7 C) (Temporal)   Resp 16   Ht 5' 5.5 (1.664 m)   Wt 160 lb 9.6 oz (72.8 kg)   SpO2 98%   BMI 26.32 kg/m  Body mass index is 26.32 kg/m. Physical Exam: General Appearance:  Alert, cooperative, no distress, appears stated age  Head:  Normocephalic, without obvious abnormality, atraumatic  Eyes:  Conjunctiva clear, EOM's intact  Ears EACs normal bilaterally and normal TMs bilaterally  Nose: Nares normal, hypertrophic turbinates, normal mucosa, and no visible  anterior polyps  Throat: Lips, tongue normal; teeth and gums normal, normal posterior oropharynx  Neck: Supple, symmetrical  Lungs:   clear to auscultation bilaterally, Respirations unlabored, no coughing   Heart:  regular rate and rhythm and no murmur, Appears well perfused  Extremities: No edema  Skin: Skin color, texture, turgor normal and no rashes or lesions on visualized portions of skin  Neurologic: No gross deficits   Labs:  Lab Orders  No laboratory test(s) ordered today    Spirometry:  Tracings reviewed. His effort: Good reproducible efforts. FVC: 3.44L FEV1: 3.12L, 100% predicted FEV1/FVC ratio: 0.91 Interpretation: Spirometry consistent with normal pattern.  Please see scanned spirometry results for details.  Assessment/Plan   Asthma with exacerbation. Out of symbicort  for the past week with increased symptoms. Start Trelegy 200 sample-1 puff daily. Rinse mouth out after use. Once you have symbicort  and spiriva , start these and stop Trelegy Use your Airsupra  or albuterol  via nebulizer every 4 hours while awake for the next 2-3 days. If no improvement, call us  and we will send in prednisone  to your pharmacy.  Continue montelukast  10 mg once a day to prevent cough or wheeze Restart Symbicort  160 mcg 2 puffs twice daily with spacer. Rinse mouth after use. Restart Spiriva  2 puffs daily. Airsupra  2 puffs every 4 hours as needed for cough or wheeze (maiximum 12 puffs/day) OR Instead use albuterol  0.083% solution OR duonebs via nebulizer one unit vial every 4 hours as needed for cough or wheeze You may use Airsupra  2 puffs 5 to 15 minutes before activity to decrease cough or wheeze Continue Fasenra  30 mg injections once every 8 weeks and have access to an epinephrine  auto-injector  Allergic rhinitis Continue xyzal  5mg  twice a day as needed for runny nose or itch Continue Flonase  2 sprays in each nostril once a day as needed for stuffy nose. In the right nostril, point the applicator out toward the right ear. In the left nostril, point the applicator out toward the left ear Consider saline nasal rinses as needed for nasal symptoms. Use this before any medicated nasal sprays for  best result Continue azelastine  2 sprays in each nostril twice a day as needed for runny nose Consider updating your environmental allergy testing - return for skin testing to consider starting allergy injections. Must be off your antihistamines for 3 days prior to this appointment.   Allergic conjunctivitis Bepreve-1 drop each eye twice daily as needed. If not covered, use pataday or zaditor. Recommend use of a lubricant eyedrop as needed Recommend eye exam  Reflux Continue dietary lifestyle modifications as listed below Continue omeprazole  20 mg twice a day to prevent reflux.  Take this medication 30 minutes before your first meal. Consider GI referral   Call the clinic if this treatment plan is not working well for you  Follow up in 6 months or sooner if needed.  It was a pleasure seeing you again in clinic today! Thank you for allowing me to participate in your care.  Other: samples provided of: Trelegy 200  Rocky Endow, MD  [facility]  

## 2024-03-26 NOTE — Patient Instructions (Addendum)
 Asthma with exacerbation. Out of symbicort  for the past week with increased symptoms. Start Trelegy 200 sample-1 puff daily. Rinse mouth out after use. Once you have symbicort  and spiriva , start these and stop Trelegy Use your Airsupra  or albuterol  via nebulizer every 4 hours while awake for the next 2-3 days. If no improvement, call us  and we will send in prednisone  to your pharmacy.  Continue montelukast  10 mg once a day to prevent cough or wheeze Restart Symbicort  160 mcg 2 puffs twice daily with spacer. Rinse mouth after use. Restart Spiriva  2 puffs daily. Airsupra  2 puffs every 4 hours as needed for cough or wheeze (maiximum 12 puffs/day) OR Instead use albuterol  0.083% solution OR duonebs via nebulizer one unit vial every 4 hours as needed for cough or wheeze You may use Airsupra  2 puffs 5 to 15 minutes before activity to decrease cough or wheeze Continue Fasenra  30 mg injections once every 8 weeks and have access to an epinephrine  auto-injector  Allergic rhinitis Continue xyzal  5mg  twice a day as needed for runny nose or itch Continue Flonase  2 sprays in each nostril once a day as needed for stuffy nose. In the right nostril, point the applicator out toward the right ear. In the left nostril, point the applicator out toward the left ear Consider saline nasal rinses as needed for nasal symptoms. Use this before any medicated nasal sprays for best result Continue azelastine  2 sprays in each nostril twice a day as needed for runny nose Consider updating your environmental allergy testing - return for skin testing to consider starting allergy injections. Must be off your antihistamines for 3 days prior to this appointment.   Allergic conjunctivitis Bepreve-1 drop each eye twice daily as needed. If not covered, use pataday or zaditor. Recommend use of a lubricant eyedrop as needed Recommend eye exam  Reflux Continue dietary lifestyle modifications as listed below Continue omeprazole  20 mg  twice a day to prevent reflux.  Take this medication 30 minutes before your first meal. Consider GI referral   Call the clinic if this treatment plan is not working well for you  Follow up in 6 months or sooner if needed.  It was a pleasure seeing you again in clinic today! Thank you for allowing me to participate in your care.  Rocky Endow, MD [facility]    Lifestyle Changes for Controlling GERD When you have GERD, stomach acid feels as if it's backing up toward your mouth. Whether or not you take medication to control your GERD, your symptoms can often be improved with lifestyle changes.   Raise Your Head Reflux is more likely to strike when you're lying down flat, because stomach fluid can flow backward more easily. Raising the head of your bed 4-6 inches can help. To do this: Slide blocks or books under the legs at the head of your bed. Or, place a wedge under the mattress. Many foam stores can make a suitable wedge for you. The wedge should run from your waist to the top of your head. Don't just prop your head on several pillows. This increases pressure on your stomach. It can make GERD worse.  Watch Your Eating Habits Certain foods may increase the acid in your stomach or relax the lower esophageal sphincter, making GERD more likely. It's best to avoid the following: Coffee, tea, and carbonated drinks (with and without caffeine) Fatty, fried, or spicy food Mint, chocolate, onions, and tomatoes Any other foods that seem to  irritate your stomach or cause you pain  Relieve the Pressure Eat smaller meals, even if you have to eat more often. Don't lie down right after you eat. Wait a few hours for your stomach to empty. Avoid tight belts and tight-fitting clothes. Lose excess weight.  Tobacco and Alcohol Avoid smoking tobacco and drinking alcohol. They can make GERD symptoms worse.

## 2024-03-27 ENCOUNTER — Telehealth: Payer: Self-pay

## 2024-03-27 ENCOUNTER — Other Ambulatory Visit (HOSPITAL_COMMUNITY): Payer: Self-pay

## 2024-03-27 NOTE — Telephone Encounter (Signed)
*  AA  Pharmacy Patient Advocate Encounter  Received notification from [facility] that Prior Authorization for Bepotastine  Besilate 1.5% solution   has been CANCELLED due to Med not covered.    Other alternatives suggested by provider are OTC and would also not be covered.

## 2024-03-30 ENCOUNTER — Other Ambulatory Visit: Payer: Self-pay | Admitting: *Deleted

## 2024-03-30 MED ORDER — TRELEGY ELLIPTA 200-62.5-25 MCG/ACT IN AEPB: INHALATION_SPRAY | RESPIRATORY_TRACT | 5 refills | Status: DC

## 2024-03-30 NOTE — Telephone Encounter (Signed)
 I called the patient and left a vm to call the office back to inform.

## 2024-03-30 NOTE — Telephone Encounter (Signed)
 Patient informed, he will get otc.

## 2024-04-03 ENCOUNTER — Ambulatory Visit: Admitting: General Practice

## 2024-04-03 DIAGNOSIS — I10 Essential (primary) hypertension: Secondary | ICD-10-CM

## 2024-04-17 ENCOUNTER — Telehealth: Payer: Self-pay

## 2024-04-17 NOTE — Telephone Encounter (Signed)
 Called in stating that he received a call from [facility] about shipment of fasenra . He stated that they told him that the office needed to call his insurance before they could ship the medication. He is due for his injection on 9/10

## 2024-04-25 ENCOUNTER — Ambulatory Visit

## 2024-04-25 DIAGNOSIS — J4550 Severe persistent asthma, uncomplicated: Secondary | ICD-10-CM | POA: Diagnosis not present

## 2024-05-03 NOTE — Telephone Encounter (Signed)
 Approved now

## 2024-05-09 DIAGNOSIS — M545 Low back pain, unspecified: Secondary | ICD-10-CM | POA: Diagnosis not present

## 2024-05-09 DIAGNOSIS — J45909 Unspecified asthma, uncomplicated: Secondary | ICD-10-CM | POA: Diagnosis not present

## 2024-05-09 DIAGNOSIS — E785 Hyperlipidemia, unspecified: Secondary | ICD-10-CM | POA: Diagnosis not present

## 2024-05-09 DIAGNOSIS — I10 Essential (primary) hypertension: Secondary | ICD-10-CM | POA: Diagnosis not present

## 2024-05-09 DIAGNOSIS — Z1330 Encounter for screening examination for mental health and behavioral disorders, unspecified: Secondary | ICD-10-CM | POA: Diagnosis not present

## 2024-05-17 ENCOUNTER — Other Ambulatory Visit: Payer: Self-pay | Admitting: Internal Medicine

## 2024-06-20 ENCOUNTER — Ambulatory Visit (INDEPENDENT_AMBULATORY_CARE_PROVIDER_SITE_OTHER)

## 2024-06-20 DIAGNOSIS — J4550 Severe persistent asthma, uncomplicated: Secondary | ICD-10-CM | POA: Diagnosis not present

## 2024-07-07 ENCOUNTER — Other Ambulatory Visit: Payer: Self-pay | Admitting: General Practice

## 2024-07-07 DIAGNOSIS — E785 Hyperlipidemia, unspecified: Secondary | ICD-10-CM

## 2024-07-10 NOTE — Telephone Encounter (Signed)
 Patient was supposed to return in August for BP f/u and did not. Please call patient to schedule his BP f/u.

## 2024-07-10 NOTE — Telephone Encounter (Signed)
Left vm to schedule apt  °

## 2024-07-17 NOTE — Telephone Encounter (Signed)
 I called and left a voicemail to be scheduled.

## 2024-07-18 ENCOUNTER — Other Ambulatory Visit: Payer: Self-pay | Admitting: Internal Medicine

## 2024-07-22 ENCOUNTER — Other Ambulatory Visit: Payer: Self-pay | Admitting: Internal Medicine

## 2024-07-30 ENCOUNTER — Other Ambulatory Visit: Payer: Self-pay | Admitting: Allergy and Immunology

## 2024-08-02 ENCOUNTER — Other Ambulatory Visit: Payer: Self-pay

## 2024-08-03 NOTE — Telephone Encounter (Signed)
 lvm for pt to call office to schedule appt.

## 2024-08-08 NOTE — Telephone Encounter (Signed)
 Patient has not called back please advise

## 2024-08-15 ENCOUNTER — Ambulatory Visit

## 2024-08-15 DIAGNOSIS — J4550 Severe persistent asthma, uncomplicated: Secondary | ICD-10-CM | POA: Diagnosis not present

## 2024-09-01 ENCOUNTER — Other Ambulatory Visit: Payer: Self-pay | Admitting: Internal Medicine

## 2024-09-05 ENCOUNTER — Other Ambulatory Visit: Payer: Self-pay | Admitting: Internal Medicine

## 2024-10-01 ENCOUNTER — Ambulatory Visit: Admitting: Internal Medicine

## 2024-10-10 ENCOUNTER — Ambulatory Visit
